# Patient Record
Sex: Male | Born: 1935
Health system: Southern US, Community
[De-identification: ages and names within clinical notes are randomized; demographics above are authoritative.]

## PROBLEM LIST (undated history)

## (undated) DIAGNOSIS — I10 Essential (primary) hypertension: Secondary | ICD-10-CM

## (undated) DIAGNOSIS — F4325 Adjustment disorder with mixed disturbance of emotions and conduct: Secondary | ICD-10-CM

## (undated) DIAGNOSIS — S065X9A Traumatic subdural hemorrhage with loss of consciousness of unspecified duration, initial encounter: Secondary | ICD-10-CM

## (undated) DIAGNOSIS — R29898 Other symptoms and signs involving the musculoskeletal system: Secondary | ICD-10-CM

## (undated) HISTORY — PX: HERNIA REPAIR: SHX51

---

## 1998-07-21 ENCOUNTER — Emergency Department (HOSPITAL_COMMUNITY): Admission: EM | Admit: 1998-07-21 | Discharge: 1998-07-21 | Payer: Self-pay | Admitting: Emergency Medicine

## 1998-07-21 ENCOUNTER — Encounter: Payer: Self-pay | Admitting: Emergency Medicine

## 2001-07-30 ENCOUNTER — Emergency Department (HOSPITAL_COMMUNITY): Admission: EM | Admit: 2001-07-30 | Discharge: 2001-07-30 | Payer: Self-pay | Admitting: Emergency Medicine

## 2001-07-30 ENCOUNTER — Encounter: Payer: Self-pay | Admitting: Emergency Medicine

## 2015-04-30 DIAGNOSIS — R1013 Epigastric pain: Secondary | ICD-10-CM | POA: Diagnosis not present

## 2015-06-18 DIAGNOSIS — R350 Frequency of micturition: Secondary | ICD-10-CM | POA: Diagnosis not present

## 2015-06-18 DIAGNOSIS — K4020 Bilateral inguinal hernia, without obstruction or gangrene, not specified as recurrent: Secondary | ICD-10-CM | POA: Diagnosis not present

## 2016-08-29 DIAGNOSIS — S161XXA Strain of muscle, fascia and tendon at neck level, initial encounter: Secondary | ICD-10-CM | POA: Diagnosis not present

## 2016-08-29 DIAGNOSIS — S46912A Strain of unspecified muscle, fascia and tendon at shoulder and upper arm level, left arm, initial encounter: Secondary | ICD-10-CM | POA: Diagnosis not present

## 2016-08-29 DIAGNOSIS — S46812A Strain of other muscles, fascia and tendons at shoulder and upper arm level, left arm, initial encounter: Secondary | ICD-10-CM | POA: Diagnosis not present

## 2016-09-02 DIAGNOSIS — S93401A Sprain of unspecified ligament of right ankle, initial encounter: Secondary | ICD-10-CM | POA: Diagnosis not present

## 2016-09-02 DIAGNOSIS — M25571 Pain in right ankle and joints of right foot: Secondary | ICD-10-CM | POA: Diagnosis not present

## 2016-09-02 DIAGNOSIS — M79671 Pain in right foot: Secondary | ICD-10-CM | POA: Diagnosis not present

## 2016-09-02 DIAGNOSIS — S9031XA Contusion of right foot, initial encounter: Secondary | ICD-10-CM | POA: Diagnosis not present

## 2016-09-02 DIAGNOSIS — M7989 Other specified soft tissue disorders: Secondary | ICD-10-CM | POA: Diagnosis not present

## 2016-09-14 DIAGNOSIS — F319 Bipolar disorder, unspecified: Secondary | ICD-10-CM | POA: Diagnosis not present

## 2016-09-14 DIAGNOSIS — Z6831 Body mass index (BMI) 31.0-31.9, adult: Secondary | ICD-10-CM | POA: Diagnosis not present

## 2016-09-14 DIAGNOSIS — K4020 Bilateral inguinal hernia, without obstruction or gangrene, not specified as recurrent: Secondary | ICD-10-CM | POA: Diagnosis not present

## 2016-09-15 DIAGNOSIS — K4020 Bilateral inguinal hernia, without obstruction or gangrene, not specified as recurrent: Secondary | ICD-10-CM | POA: Diagnosis not present

## 2016-09-26 DIAGNOSIS — Z5331 Laparoscopic surgical procedure converted to open procedure: Secondary | ICD-10-CM | POA: Diagnosis not present

## 2016-09-26 DIAGNOSIS — K4000 Bilateral inguinal hernia, with obstruction, without gangrene, not specified as recurrent: Secondary | ICD-10-CM | POA: Diagnosis not present

## 2016-10-01 DIAGNOSIS — K469 Unspecified abdominal hernia without obstruction or gangrene: Secondary | ICD-10-CM | POA: Diagnosis not present

## 2016-10-11 DIAGNOSIS — Z6830 Body mass index (BMI) 30.0-30.9, adult: Secondary | ICD-10-CM | POA: Diagnosis not present

## 2016-10-11 DIAGNOSIS — R002 Palpitations: Secondary | ICD-10-CM | POA: Diagnosis not present

## 2016-10-11 DIAGNOSIS — Z9889 Other specified postprocedural states: Secondary | ICD-10-CM | POA: Diagnosis not present

## 2016-10-11 DIAGNOSIS — Z8659 Personal history of other mental and behavioral disorders: Secondary | ICD-10-CM | POA: Diagnosis not present

## 2016-11-16 DIAGNOSIS — F4329 Adjustment disorder with other symptoms: Secondary | ICD-10-CM | POA: Diagnosis not present

## 2018-05-29 ENCOUNTER — Encounter (HOSPITAL_COMMUNITY): Payer: Self-pay | Admitting: Emergency Medicine

## 2018-05-29 ENCOUNTER — Other Ambulatory Visit: Payer: Self-pay

## 2018-05-29 ENCOUNTER — Emergency Department (HOSPITAL_COMMUNITY): Payer: Medicare HMO

## 2018-05-29 ENCOUNTER — Ambulatory Visit (INDEPENDENT_AMBULATORY_CARE_PROVIDER_SITE_OTHER)
Admission: EM | Admit: 2018-05-29 | Discharge: 2018-05-29 | Disposition: A | Discharge status: Home / Self Care | Payer: Medicare HMO | Source: Home / Self Care

## 2018-05-29 ENCOUNTER — Emergency Department (HOSPITAL_COMMUNITY)
Admission: EM | Admit: 2018-05-29 | Discharge: 2018-05-29 | Disposition: A | Discharge status: Home / Self Care | Payer: Medicare HMO | Attending: Emergency Medicine | Admitting: Emergency Medicine

## 2018-05-29 DIAGNOSIS — R079 Chest pain, unspecified: Secondary | ICD-10-CM | POA: Insufficient documentation

## 2018-05-29 DIAGNOSIS — Z5321 Procedure and treatment not carried out due to patient leaving prior to being seen by health care provider: Secondary | ICD-10-CM | POA: Insufficient documentation

## 2018-05-29 DIAGNOSIS — R0789 Other chest pain: Secondary | ICD-10-CM | POA: Diagnosis not present

## 2018-05-29 NOTE — ED Triage Notes (Signed)
Pt states for the last 5 days hes had some dull chest pain on the left side, pt states he burps and it still doesn't go away, thinks it might be gas. Pt states he has no medical problems and is not on any meds. Pt appears healthy. Pt states the pain comes and goes. EKG obtained, given to Dr. Tracie HarrierHagler, per MD pt needs eval in ER to rule out cardiac event. Pt agreeable to plan, will drive himself to ER.

## 2018-05-29 NOTE — ED Notes (Signed)
Pt approached this NT and stated that he had to leave. Informed pt that if he left and came back he would have to start the process all over again. Pt stated understanding and expressed that he wanted to leave.

## 2018-05-29 NOTE — ED Notes (Signed)
Pt refused lab work.

## 2018-05-29 NOTE — ED Triage Notes (Signed)
Pt presents with CP x 4 days to the L side that he reports pain as sharp and heavy; pt denies sob, n/v, diaphoresis, some lightheadedness that has resolved; pt reports taking some vitamins/minerals that may have contributed to the pain;

## 2019-10-07 ENCOUNTER — Other Ambulatory Visit: Payer: Self-pay

## 2019-10-07 ENCOUNTER — Encounter (HOSPITAL_COMMUNITY): Payer: Self-pay | Admitting: Emergency Medicine

## 2019-10-07 ENCOUNTER — Ambulatory Visit (HOSPITAL_COMMUNITY)
Admission: EM | Admit: 2019-10-07 | Discharge: 2019-10-07 | Disposition: A | Discharge status: Home / Self Care | Payer: Medicare HMO | Attending: Family Medicine | Admitting: Family Medicine

## 2019-10-07 ENCOUNTER — Ambulatory Visit (INDEPENDENT_AMBULATORY_CARE_PROVIDER_SITE_OTHER): Payer: Medicare HMO

## 2019-10-07 DIAGNOSIS — R0602 Shortness of breath: Secondary | ICD-10-CM

## 2019-10-07 DIAGNOSIS — Z20822 Contact with and (suspected) exposure to covid-19: Secondary | ICD-10-CM | POA: Diagnosis not present

## 2019-10-07 DIAGNOSIS — J069 Acute upper respiratory infection, unspecified: Secondary | ICD-10-CM | POA: Insufficient documentation

## 2019-10-07 DIAGNOSIS — U071 COVID-19: Secondary | ICD-10-CM | POA: Insufficient documentation

## 2019-10-07 NOTE — ED Provider Notes (Signed)
[place]    CSN: [id number] Arrival date & time: 10/07/19  1744      History   Chief Complaint Chief Complaint  Patient presents with  . Cough    HPI Jerry Flores is a 84 y.o. male.   HPI  The patient states that he has been sick since 09/30/2019.  He has had lethargy, cough, hot flashes with sweats, slight dizziness, weakness, and runny nose.  He does feel short of breath.  He lacks stamina for walking or any exercise.  His appetite is diminished. He states he had a close friend that was positive for Covid.  He helped to take care of him.  He did try to wear his mask and use good handwashing. He denies any underlying medical illness.  He states he is on no prescription medication.  No heart disease.  No lung disease.  No diabetes. He is 84 years of age.  He lives independently.  History reviewed. No pertinent past medical history.  There are no problems to display for this patient.   Past Surgical History:  Procedure Laterality Date  . HERNIA REPAIR         Home Medications    Prior to Admission medications   Not on File    Family History History reviewed. No pertinent family history.  Social History Social History   Tobacco Use  . Smoking status: Never Smoker  Substance Use Topics  . Alcohol use: Not Currently  . Drug use: Not Currently     Allergies   Patient has no known allergies.   Review of Systems Review of Systems  Constitutional: Positive for activity change, appetite change, diaphoresis and fatigue.  HENT: Positive for congestion and rhinorrhea.   Respiratory: Positive for shortness of breath.   Musculoskeletal: Positive for myalgias.  Neurological: Positive for dizziness. Negative for headaches.     Physical Exam Triage Vital Signs ED Triage Vitals  Enc Vitals Group     BP 10/07/19 1811 114/68     Pulse Rate 10/07/19 1811 87     Resp 10/07/19 1811 19     Temp 10/07/19 1811 98.9 F (37.2 C)     Temp Source  10/07/19 1811 Oral     SpO2 10/07/19 1811 97 %     Weight --      Height --      Head Circumference --      Peak Flow --      Pain Score 10/07/19 1806 5     Pain Loc --      Pain Edu? --      Excl. in [place]? --    No data found.  Updated Vital Signs BP 114/68 (BP Location: Right Arm)   Pulse 87   Temp 98.9 F (37.2 C) (Oral)   Resp 19   SpO2 97%      Physical Exam Constitutional:      General: He is not in acute distress.    Appearance: He is well-developed and normal weight.     Comments: Pleasant.  Articulate.  Appears mildly tired  HENT:     Head: Normocephalic and atraumatic.     Right Ear: Tympanic membrane and ear canal normal.     Left Ear: Tympanic membrane and ear canal normal.     Nose: Nose normal.     Mouth/Throat:     Pharynx: No posterior oropharyngeal erythema.  Eyes:     Conjunctiva/sclera: Conjunctivae normal.     Pupils:  Pupils are equal, round, and reactive to light.  Cardiovascular:     Rate and Rhythm: Normal rate and regular rhythm.     Comments: Frequent ectopy.  Soft systolic murmur Pulmonary:     Effort: Pulmonary effort is normal. No respiratory distress.     Breath sounds: Rales present.     Comments: Faint crackles at bases Abdominal:     General: There is no distension.     Palpations: Abdomen is soft.  Musculoskeletal:        General: Normal range of motion.     Cervical back: Normal range of motion.  Lymphadenopathy:     Cervical: No cervical adenopathy.  Skin:    General: Skin is warm and dry.  Neurological:     Mental Status: He is alert. Mental status is at baseline.  Psychiatric:        Mood and Affect: Mood normal.        Behavior: Behavior normal.      UC Treatments / Results  Labs (all labs ordered are listed, but only abnormal results are displayed) Labs Reviewed  NOVEL CORONAVIRUS, NAA (HOSP ORDER, SEND-OUT TO REF LAB; TAT 18-24 HRS)    EKG patient has a Mobitz type II block with some sinus tachycardia.  No ST  or T wave changes indicating acute ischemia.  Discussed with patient.  He refuses to go to the ER.  He does state that he will go to the ER if he starts feeling worse instead of better.  He will call his PCP tomorrow   Radiology DG Chest 2 View  Result Date: 10/07/2019 CLINICAL DATA:  Shortness of breath. EXAM: CHEST - 2 VIEW COMPARISON:  May 29, 2018. FINDINGS: The heart size and mediastinal contours are within normal limits. Both lungs are clear. No pneumothorax or pleural effusion is noted. The visualized skeletal structures are unremarkable. IMPRESSION: No active cardiopulmonary disease. Electronically Signed   By: Lupita Raider M.D.   On: 10/07/2019 18:55    Procedures Procedures (including critical care time)  Medications Ordered in UC Medications - No data to display  Initial Impression / Assessment and Plan / UC Course  I have reviewed the triage vital signs and the nursing notes.  Pertinent labs & imaging results that were available during my care of the patient were reviewed by me and considered in my medical decision making (see chart for details).      Final Clinical Impressions(s) / UC Diagnoses   Final diagnoses:  Viral URI with cough  Close exposure to COVID-19 virus     Discharge Instructions     Home to rest You may take tylenol for pain or fever Your chest x ray shows no pneumonia Your blood pressure and oxygen are excellent Your COVID test will be available If it is positive you will be called about an infusion The EKG should be followed up by your PCP, but it is not dangerous     ED Prescriptions    None     PDMP not reviewed this encounter.   Eustace Moore, MD 10/07/19 Jerene Bears

## 2019-10-07 NOTE — ED Triage Notes (Signed)
09/30/2019 was the start of feeling bad.  Patient has a cough, hot flash, slight dizziness, slight weakness, lethargic, and runny nose.

## 2019-10-07 NOTE — Discharge Instructions (Addendum)
Home to rest You may take tylenol for pain or fever Your chest x ray shows no pneumonia Your blood pressure and oxygen are excellent Your COVID test will be available If it is positive you will be called about an infusion The EKG should be followed up by your PCP,You must call him tomorrow.

## 2019-10-09 ENCOUNTER — Telehealth (HOSPITAL_COMMUNITY): Payer: Self-pay | Admitting: Emergency Medicine

## 2019-10-09 LAB — NOVEL CORONAVIRUS, NAA (HOSP ORDER, SEND-OUT TO REF LAB; TAT 18-24 HRS): SARS-CoV-2, NAA: DETECTED — AB

## 2019-10-09 NOTE — Telephone Encounter (Signed)
Your test for COVID-19 was positive, meaning that you were infected with the novel coronavirus and could give the germ to others.  Please continue isolation at home for at least 10 days since the start of your symptoms. If you do not have symptoms, please isolate at home for 10 days from the day you were tested. Once you complete your 10 day quarantine, you may return to normal activities as long as you've not had a fever for over 24 hours(without taking fever reducing medicine) and your symptoms are improving. Please continue good preventive care measures, including:  frequent hand-washing, avoid touching your face, cover coughs/sneezes, stay out of crowds and keep a 6 foot distance from others.  Go to the nearest hospital emergency room if fever/cough/breathlessness are severe or illness seems like a threat to life.  Patient contacted by phone and made aware of    results. Pt verbalized understanding and had all questions answered.  Pt states he is feeling somewhat better, still no appetite, but drinking fluids fine. Pt will call his PCP about his dx and follow up.

## 2019-10-10 ENCOUNTER — Telehealth: Payer: Self-pay | Admitting: Physician Assistant

## 2019-10-10 ENCOUNTER — Telehealth: Payer: Self-pay | Admitting: Nurse Practitioner

## 2019-10-10 NOTE — Telephone Encounter (Signed)
Called to discuss with Jerry Flores about Covid symptoms and the use of bamlanivimab, a monoclonal antibody infusion for those with mild to moderate Covid symptoms and at a high risk of hospitalization.     Pt is qualified for this infusion at the [facility] infusion center due to co-morbid conditions and/or a member of an at-risk group. Last day patient would be eligible for infusion is 10/15/19.   Also received message via hotline from Dr. Delton See requesting patient receive infusion.   Unable to reach patient. Voicemail left with contact information.   Jerry Flores, AGPCNP-BC Pager: [phone] Amion: Jerry Flores

## 2019-10-10 NOTE — Telephone Encounter (Signed)
Jerry Flores    My name is [name] and I am a Advice worker with [facility]. I am reaching out to see how you are feeling since coming to [facility] to be tested for COVID-19 infection. I am hopeful this message finds you feeling better.    I would also like to inform you that we are attempting to contact some of our patients who have recently been diagnosed with COVID-19 with active mild to moderate symptoms regarding a potential treatment available. This treatment has been shown to reduce the risk of hospitalization and decrease the risk for severe symptoms related to COVID-19 in a select group of patients with risk factors / medical conditions we believe put people at a higher risk for this infection.    The Food and Drug Administration (FDA) has given emergency use of a new medication to treat those with mild to moderate symptoms who have risk factors that have been identified for severe symptoms related to COVID-19. This new treatment is a monoclonal antibody - the way it works its that it attaches like a magnet to the SARS-CoV2 virus (the virus that causes COVID-19) and stops it from infecting more cells in your body. It does not kill the virus but it prevents it from spreading throughout your body with the hope that it will decrease your symptoms after it is given.    This new medication is an intravenous (IV) infusion that is given over one hour in our outpatient infusion clinic here in [place]. We will require you to stay roughly 60 minutes after the infusion to ensure you are tolerating it and watch for any allergic reaction to the medication. More information will also be given to you at the time of your appointment.    The side effects that have been seen with this treatment:  2-4% of recipients experience nausea, vomiting, diarrhea, dizziness, headaches, itching  There have been no serious infusion related reactions  Of the 850 patients who received the infusion one did  have an allergic response that resolved once the infusion was stopped - this is why we monitor all of our patients closely for an hour after the infusion.    If you are interested in our assistance to determine if you are a good candidate please call [phone] with questions.   Alternatively, one of our team members will attempt to reach back out to you (I did attempt to call your mobile and home phone numbers but did not get an answer on either line).   For your reference:  [email or web address]  Regards,  [name], PA - C

## 2020-01-26 ENCOUNTER — Encounter (HOSPITAL_COMMUNITY): Payer: Self-pay

## 2020-01-26 ENCOUNTER — Ambulatory Visit (HOSPITAL_COMMUNITY)
Admission: EM | Admit: 2020-01-26 | Discharge: 2020-01-26 | Disposition: A | Discharge status: Home / Self Care | Payer: Medicare HMO | Attending: Family Medicine | Admitting: Family Medicine

## 2020-01-26 ENCOUNTER — Other Ambulatory Visit: Payer: Self-pay

## 2020-01-26 DIAGNOSIS — R21 Rash and other nonspecific skin eruption: Secondary | ICD-10-CM | POA: Diagnosis not present

## 2020-01-26 MED ORDER — TRIAMCINOLONE ACETONIDE 0.1 % EX CREA: 1.0000 "application " | TOPICAL_CREAM | Freq: Two times a day (BID) | CUTANEOUS | 0 refills | Status: DC

## 2020-01-26 NOTE — ED Provider Notes (Signed)
[place]    CSN: [id number] Arrival date & time: 01/26/20  1929      History   Chief Complaint Chief Complaint  Patient presents with  . Rash    HPI Devontre Siedschlag is a 84 y.o. male.   He is presenting with rash and pruritus on the left posterior shoulder that radiates down the flank.  He has been sleeping on a new friend's sheets.  She does get some of them at [facility].  Denies seeing any bedbugs.  He has put [place] on it with little improvement.  Denies any contacts.  HPI  History reviewed. No pertinent past medical history.  There are no problems to display for this patient.   Past Surgical History:  Procedure Laterality Date  . HERNIA REPAIR         Home Medications    Prior to Admission medications   Medication Sig Start Date End Date Taking? Authorizing Provider  triamcinolone cream (KENALOG) 0.1 % Apply 1 application topically 2 (two) times daily. 01/26/20   Rosemarie Ax, MD    Family History History reviewed. No pertinent family history.  Social History Social History   Tobacco Use  . Smoking status: Never Smoker  Substance Use Topics  . Alcohol use: Not Currently  . Drug use: Not Currently     Allergies   Patient has no known allergies.   Review of Systems Review of Systems  See HPI  Physical Exam Triage Vital Signs ED Triage Vitals [01/26/20 2017]  Enc Vitals Group     BP      Pulse      Resp      Temp      Temp src      SpO2      Weight      Height      Head Circumference      Peak Flow      Pain Score 0     Pain Loc      Pain Edu?      Excl. in [place]?    No data found.  Updated Vital Signs BP (!) 161/90 (BP Location: Right Arm)   Pulse 72   Temp 98 F (36.7 C) (Oral)   Resp 17   SpO2 98%   Visual Acuity Right Eye Distance:   Left Eye Distance:   Bilateral Distance:    Right Eye Near:   Left Eye Near:    Bilateral Near:     Physical Exam Gen: NAD, alert, cooperative with exam,  well-appearing ENT: normal lips, normal nasal mucosa,  Eye: normal EOM, normal conjunctiva and lids Skin: No vesicles or lesions, excoriated areas on the posterior glenohumeral joint as well as down the mid flank region Neuro: normal tone, normal sensation to touch Psych:  normal insight, alert and oriented    UC Treatments / Results  Labs (all labs ordered are listed, but only abnormal results are displayed) Labs Reviewed - No data to display  EKG   Radiology No results found.  Procedures Procedures (including critical care time)  Medications Ordered in UC Medications - No data to display  Initial Impression / Assessment and Plan / UC Course  I have reviewed the triage vital signs and the nursing notes.  Pertinent labs & imaging results that were available during my care of the patient were reviewed by me and considered in my medical decision making (see chart for details).     Mr. Nyoka Cowden is an 84 year old  male is presenting with rash.  Possibly related to dry skin.  Seems less likely for bedbugs.  Does not appear to be contact dermatitis.  Will provide triamcinolone.  Counseled on lotion and supportive care.  Given indications to follow-up return.  Final Clinical Impressions(s) / UC Diagnoses   Final diagnoses:  Rash     Discharge Instructions     Please try to avoid scratching the area  Please practice good hygiene.  Please check for any bugs.  Please try a lotion  Please try the medicine  Please follow up if your symptoms fail to improve.     ED Prescriptions    Medication Sig Dispense Auth. Provider   triamcinolone cream (KENALOG) 0.1 % Apply 1 application topically 2 (two) times daily. 30 g Myra Rude, MD     PDMP not reviewed this encounter.   Myra Rude, MD 01/26/20 2059

## 2020-01-26 NOTE — Discharge Instructions (Addendum)
Please try to avoid scratching the area  Please practice good hygiene.  Please check for any bugs.  Please try a lotion  Please try the medicine  Please follow up if your symptoms fail to improve.

## 2020-01-26 NOTE — ED Triage Notes (Signed)
Pt presents with rash on left side of back for the past few days.

## 2020-04-08 ENCOUNTER — Other Ambulatory Visit: Payer: Self-pay

## 2020-04-08 ENCOUNTER — Ambulatory Visit (HOSPITAL_COMMUNITY)
Admission: EM | Admit: 2020-04-08 | Discharge: 2020-04-08 | Disposition: A | Discharge status: Home / Self Care | Payer: Medicare Other | Attending: Urgent Care | Admitting: Urgent Care

## 2020-04-08 ENCOUNTER — Encounter (HOSPITAL_COMMUNITY): Payer: Self-pay | Admitting: Emergency Medicine

## 2020-04-08 DIAGNOSIS — M545 Low back pain, unspecified: Secondary | ICD-10-CM

## 2020-04-08 DIAGNOSIS — J029 Acute pharyngitis, unspecified: Secondary | ICD-10-CM | POA: Diagnosis not present

## 2020-04-08 DIAGNOSIS — S39012A Strain of muscle, fascia and tendon of lower back, initial encounter: Secondary | ICD-10-CM | POA: Diagnosis present

## 2020-04-08 DIAGNOSIS — M7989 Other specified soft tissue disorders: Secondary | ICD-10-CM | POA: Diagnosis present

## 2020-04-08 LAB — POCT RAPID STREP A, ED / UC: Streptococcus, Group A Screen (Direct): NEGATIVE

## 2020-04-08 MED ORDER — TRAMADOL HCL 50 MG PO TABS: 50.0000 mg | ORAL_TABLET | Freq: Four times a day (QID) | ORAL | 0 refills | Status: DC | PRN

## 2020-04-08 MED ORDER — METHOCARBAMOL 500 MG PO TABS: 500.0000 mg | ORAL_TABLET | Freq: Three times a day (TID) | ORAL | 0 refills | Status: DC | PRN

## 2020-04-08 NOTE — ED Notes (Addendum)
Pt has Vascular Ultrasound on [facility] @ 9 am - Tomorrow  Friday 04/09/2020

## 2020-04-08 NOTE — Discharge Instructions (Addendum)
Jerry Flores, you have an appointment for for vascular ultrasound to rule out a blood clot in your left lower leg at 9 AM tomorrow through the [facility]. Please try to arrive 15 minutes early. Please schedule Tylenol at 500 mg - 650 mg once every 6 hours as needed for aches and pains.  If you still have pain despite taking Tylenol regularly, this is breakthrough pain.  You can use tramadol once every 6 hours for this.  Once your pain is better controlled, switch back to just Tylenol. You can use the same medication regimen for your throat pain. I will let you know if your throat culture comes back and is positive for an infection.

## 2020-04-08 NOTE — ED Provider Notes (Signed)
[facility]   MRN: [id number] DOB: August 07, 1936  Subjective:   Jerry Flores is a 84 y.o. male presenting for 4-day history of mild intermittent throat pain, hoarseness.  Patient's primary concern is back pain however, states that he had to change a tire recently and was very challenging.  States that he had to jump up and down on the tire iron to loosen it.  He also works in [employer].  Has had persistent intermittent left lower leg swelling for weeks now.  Denies history of DVT, falls, trauma, weakness, numbness or tingling, dysuria, hematuria, history of renal stones.  Denies chest pain or shortness of breath.  Has not tried many medications for relief.  No current facility-administered medications for this encounter.  Current Outpatient Medications:  .  triamcinolone cream (KENALOG) 0.1 %, Apply 1 application topically 2 (two) times daily., Disp: 30 g, Rfl: 0   No Known Allergies  History reviewed. No pertinent past medical history.   Past Surgical History:  Procedure Laterality Date  . HERNIA REPAIR      No family history on file.  Social History   Tobacco Use  . Smoking status: Never Smoker  Substance Use Topics  . Alcohol use: Not Currently  . Drug use: Not Currently    ROS   Objective:   Vitals: BP (!) 147/85 (BP Location: Left Arm)   Pulse 90   Temp 98.4 F (36.9 C) (Oral)   Resp 15   SpO2 100%   Physical Exam Constitutional:      General: He is not in acute distress.    Appearance: Normal appearance. He is well-developed. He is not ill-appearing, toxic-appearing or diaphoretic.  HENT:     Head: Normocephalic and atraumatic.     Right Ear: External ear normal.     Left Ear: External ear normal.     Nose: Nose normal.     Mouth/Throat:     Mouth: Mucous membranes are moist.     Pharynx: Oropharynx is clear. No pharyngeal swelling, oropharyngeal exudate, posterior oropharyngeal erythema or uvula swelling.  Eyes:     General: No  scleral icterus.       Right eye: No discharge.        Left eye: No discharge.     Extraocular Movements: Extraocular movements intact.     Conjunctiva/sclera: Conjunctivae normal.     Pupils: Pupils are equal, round, and reactive to light.  Cardiovascular:     Rate and Rhythm: Normal rate and regular rhythm.     Heart sounds: Normal heart sounds. No murmur heard.  No friction rub. No gallop.   Pulmonary:     Effort: Pulmonary effort is normal. No respiratory distress.     Breath sounds: Normal breath sounds. No stridor. No wheezing, rhonchi or rales.  Musculoskeletal:     Cervical back: Normal range of motion and neck supple. No rigidity or tenderness.     Lumbar back: Tenderness (along area outlined) present. No swelling, edema, deformity, signs of trauma, lacerations, spasms or bony tenderness. Normal range of motion. Negative right straight leg raise test and negative left straight leg raise test. No scoliosis.       Back:     Left lower leg: Swelling present. No deformity, lacerations, tenderness or bony tenderness. 2+ Edema present.     Comments: Strength 5/5 for upper and lower extremities.  Negative Homan sign.  Lymphadenopathy:     Cervical: No cervical adenopathy.  Skin:    General:  Skin is warm and dry.  Neurological:     Mental Status: He is alert and oriented to person, place, and time.     Cranial Nerves: No cranial nerve deficit.     Motor: No weakness.     Coordination: Coordination normal.     Gait: Gait normal.     Deep Tendon Reflexes: Reflexes normal.  Psychiatric:        Mood and Affect: Mood normal.        Behavior: Behavior normal.        Thought Content: Thought content normal.        Judgment: Judgment normal.     Results for orders placed or performed during the hospital encounter of 04/08/20 (from the past 24 hour(s))  POCT Rapid Strep A     Status: None   Collection Time: 04/08/20  3:21 PM  Result Value Ref Range   Streptococcus, Group A Screen  (Direct) NEGATIVE NEGATIVE   Assessment and Plan :   I have reviewed the PDMP during this encounter.  1. Sore throat   2. Acute bilateral low back pain without sciatica   3. Strain of lumbar region, initial encounter   4. Left leg swelling     Ultrasound pending for rule out of DVT.  Strep culture pending.  Recommended patient rest, hold off on working for now.  Schedule Tylenol for lumbar strain/back pain.  Use tramadol for breakthrough pain.  Patient requested a muscle relaxant as well, Robaxin can be used for this. Counseled patient on potential for adverse effects with medications prescribed/recommended today, ER and return-to-clinic precautions discussed, patient verbalized understanding.    Wallis Bamberg, [place] 04/08/20 [phone]

## 2020-04-08 NOTE — ED Triage Notes (Signed)
Pt c/o throat has been hoarse for about 4 days. Pt also c/o back pain he states he was helping her neighbor with her car and did not have a jack and tried to lift the car.

## 2020-04-09 ENCOUNTER — Ambulatory Visit (HOSPITAL_COMMUNITY): Payer: Medicare Other | Attending: Urgent Care

## 2020-04-09 ENCOUNTER — Ambulatory Visit (HOSPITAL_COMMUNITY): Admit: 2020-04-09 | Payer: Medicare Other

## 2020-04-09 ENCOUNTER — Other Ambulatory Visit (HOSPITAL_COMMUNITY): Payer: Self-pay | Admitting: Urgent Care

## 2020-04-09 DIAGNOSIS — M79605 Pain in left leg: Secondary | ICD-10-CM

## 2020-04-11 ENCOUNTER — Encounter (HOSPITAL_COMMUNITY): Payer: Self-pay | Admitting: Emergency Medicine

## 2020-04-11 ENCOUNTER — Emergency Department (HOSPITAL_COMMUNITY)
Admission: EM | Admit: 2020-04-11 | Discharge: 2020-04-11 | Disposition: A | Discharge status: Home / Self Care | Payer: Medicare Other | Attending: Emergency Medicine | Admitting: Emergency Medicine

## 2020-04-11 ENCOUNTER — Other Ambulatory Visit: Payer: Self-pay

## 2020-04-11 DIAGNOSIS — M545 Low back pain: Secondary | ICD-10-CM | POA: Insufficient documentation

## 2020-04-11 DIAGNOSIS — M79604 Pain in right leg: Secondary | ICD-10-CM | POA: Diagnosis not present

## 2020-04-11 DIAGNOSIS — M79605 Pain in left leg: Secondary | ICD-10-CM | POA: Diagnosis not present

## 2020-04-11 DIAGNOSIS — Z5321 Procedure and treatment not carried out due to patient leaving prior to being seen by health care provider: Secondary | ICD-10-CM | POA: Diagnosis not present

## 2020-04-11 LAB — CULTURE, GROUP A STREP (THRC)

## 2020-04-11 NOTE — ED Triage Notes (Signed)
C/o lower back pain that radiates down bilateral legs x 2 days.  Pain started after changing a tire.  Ambulatory to triage.

## 2020-04-11 NOTE — ED Notes (Signed)
Called x 3 no answer 

## 2020-04-19 ENCOUNTER — Emergency Department (HOSPITAL_COMMUNITY): Payer: Medicare Other

## 2020-04-19 ENCOUNTER — Emergency Department (HOSPITAL_COMMUNITY)
Admission: EM | Admit: 2020-04-19 | Discharge: 2020-04-20 | Disposition: A | Discharge status: Home / Self Care | Payer: Medicare Other | Attending: Emergency Medicine | Admitting: Emergency Medicine

## 2020-04-19 ENCOUNTER — Other Ambulatory Visit: Payer: Self-pay

## 2020-04-19 DIAGNOSIS — S0990XA Unspecified injury of head, initial encounter: Secondary | ICD-10-CM | POA: Diagnosis present

## 2020-04-19 DIAGNOSIS — R609 Edema, unspecified: Secondary | ICD-10-CM

## 2020-04-19 DIAGNOSIS — Y9289 Other specified places as the place of occurrence of the external cause: Secondary | ICD-10-CM | POA: Diagnosis not present

## 2020-04-19 DIAGNOSIS — S0083XA Contusion of other part of head, initial encounter: Secondary | ICD-10-CM | POA: Diagnosis not present

## 2020-04-19 DIAGNOSIS — Y9389 Activity, other specified: Secondary | ICD-10-CM | POA: Insufficient documentation

## 2020-04-19 DIAGNOSIS — Y999 Unspecified external cause status: Secondary | ICD-10-CM | POA: Diagnosis not present

## 2020-04-19 LAB — CBC WITH DIFFERENTIAL/PLATELET
Abs Immature Granulocytes: 0.01 10*3/uL (ref 0.00–0.07)
Basophils Absolute: 0 10*3/uL (ref 0.0–0.1)
Basophils Relative: 0 %
Eosinophils Absolute: 0.1 10*3/uL (ref 0.0–0.5)
Eosinophils Relative: 1 %
HCT: 38.2 % — ABNORMAL LOW (ref 39.0–52.0)
Hemoglobin: 12.4 g/dL — ABNORMAL LOW (ref 13.0–17.0)
Immature Granulocytes: 0 %
Lymphocytes Relative: 27 %
Lymphs Abs: 1.2 10*3/uL (ref 0.7–4.0)
MCH: 31.2 pg (ref 26.0–34.0)
MCHC: 32.5 g/dL (ref 30.0–36.0)
MCV: 96.2 fL (ref 80.0–100.0)
Monocytes Absolute: 0.4 10*3/uL (ref 0.1–1.0)
Monocytes Relative: 9 %
Neutro Abs: 2.9 10*3/uL (ref 1.7–7.7)
Neutrophils Relative %: 63 %
Platelets: 246 10*3/uL (ref 150–400)
RBC: 3.97 MIL/uL — ABNORMAL LOW (ref 4.22–5.81)
RDW: 13.6 % (ref 11.5–15.5)
WBC: 4.7 10*3/uL (ref 4.0–10.5)
nRBC: 0 % (ref 0.0–0.2)

## 2020-04-19 LAB — COMPREHENSIVE METABOLIC PANEL
ALT: 41 U/L (ref 0–44)
AST: 50 U/L — ABNORMAL HIGH (ref 15–41)
Albumin: 3.5 g/dL (ref 3.5–5.0)
Alkaline Phosphatase: 55 U/L (ref 38–126)
Anion gap: 10 (ref 5–15)
BUN: 11 mg/dL (ref 8–23)
CO2: 24 mmol/L (ref 22–32)
Calcium: 9.1 mg/dL (ref 8.9–10.3)
Chloride: 104 mmol/L (ref 98–111)
Creatinine, Ser: 1.28 mg/dL — ABNORMAL HIGH (ref 0.61–1.24)
GFR calc Af Amer: >60 mL/min (ref >60)
GFR calc non Af Amer: 52 mL/min — ABNORMAL LOW (ref >60)
Glucose, Bld: 126 mg/dL — ABNORMAL HIGH (ref 70–99)
Potassium: 3.6 mmol/L (ref 3.5–5.1)
Sodium: 138 mmol/L (ref 135–145)
Total Bilirubin: 0.9 mg/dL (ref 0.3–1.2)
Total Protein: 6.8 g/dL (ref 6.5–8.1)

## 2020-04-19 LAB — BRAIN NATRIURETIC PEPTIDE: B Natriuretic Peptide: 61.1 pg/mL (ref 0.0–100.0)

## 2020-04-19 MED ORDER — ACETAMINOPHEN 500 MG PO TABS: 1000.0000 mg | ORAL_TABLET | Freq: Once | ORAL | Status: DC

## 2020-04-19 MED ORDER — OXYCODONE HCL 5 MG PO TABS: 5.0000 mg | ORAL_TABLET | Freq: Once | ORAL | Status: DC

## 2020-04-19 NOTE — Discharge Instructions (Signed)
Follow-up with your family doctor in the office.  The CT scan did not show any broken bones did show some increased lymphoid tissue, your family doctor may choose to reimage this with a different imaging study.

## 2020-04-19 NOTE — Discharge Planning (Signed)
RNCM provided [employer] as pt has no transportation to [place], [place], [place] from hospital.
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
## 2020-04-20 NOTE — ED Notes (Signed)
Pt was discharged in NAD on 04/19/2020 at approximately 1100.

## 2020-05-30 ENCOUNTER — Emergency Department (HOSPITAL_COMMUNITY)
Admission: EM | Admit: 2020-05-30 | Discharge: 2020-05-30 | Discharge status: Against Medical Advice | Payer: Medicare Other

## 2020-05-30 ENCOUNTER — Other Ambulatory Visit: Payer: Self-pay

## 2020-05-30 ENCOUNTER — Encounter (HOSPITAL_COMMUNITY): Payer: Self-pay | Admitting: Emergency Medicine

## 2020-05-30 NOTE — ED Notes (Signed)
Called pt name x3. No response from pt.

## 2020-05-30 NOTE — ED Triage Notes (Signed)
Patient arrives to ED via EMS with complaints of lower extremity swelling x1 month. States that he is now having trouble walking.

## 2020-05-31 ENCOUNTER — Encounter (HOSPITAL_COMMUNITY): Payer: Self-pay | Admitting: Emergency Medicine

## 2020-05-31 ENCOUNTER — Ambulatory Visit (HOSPITAL_COMMUNITY)
Admission: EM | Admit: 2020-05-31 | Discharge: 2020-05-31 | Disposition: A | Discharge status: Home / Self Care | Payer: Medicare Other | Attending: Family Medicine | Admitting: Family Medicine

## 2020-05-31 DIAGNOSIS — R609 Edema, unspecified: Secondary | ICD-10-CM
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
## 2020-05-31 NOTE — Discharge Instructions (Signed)
Please use compression stockings to help with your swelling.  Please follow up with your primary care provider this week for a recheck.  Please do not take more than 1 pill twice a day, as it can be very dangerous to take too much of the lasix you have.  I do recommend that your blood levels are checked before you continue taking this.

## 2020-05-31 NOTE — ED Notes (Signed)
Pt refused blood work  

## 2020-05-31 NOTE — ED Notes (Signed)
Pt 's ankles wrapped with ace wraps

## 2020-06-10 ENCOUNTER — Other Ambulatory Visit: Payer: Self-pay

## 2020-06-10 ENCOUNTER — Encounter (HOSPITAL_BASED_OUTPATIENT_CLINIC_OR_DEPARTMENT_OTHER): Payer: Self-pay | Admitting: *Deleted

## 2020-06-10 ENCOUNTER — Emergency Department (HOSPITAL_BASED_OUTPATIENT_CLINIC_OR_DEPARTMENT_OTHER)
Admission: EM | Admit: 2020-06-10 | Discharge: 2020-06-10 | Disposition: A | Discharge status: Home / Self Care | Payer: Medicare Other | Source: Home / Self Care | Attending: Emergency Medicine | Admitting: Emergency Medicine

## 2020-06-10 ENCOUNTER — Emergency Department (HOSPITAL_BASED_OUTPATIENT_CLINIC_OR_DEPARTMENT_OTHER): Payer: Medicare Other

## 2020-06-10 DIAGNOSIS — R600 Localized edema: Secondary | ICD-10-CM | POA: Insufficient documentation

## 2020-06-10 DIAGNOSIS — R29898 Other symptoms and signs involving the musculoskeletal system: Secondary | ICD-10-CM | POA: Diagnosis not present

## 2020-06-10 DIAGNOSIS — F319 Bipolar disorder, unspecified: Secondary | ICD-10-CM | POA: Diagnosis not present

## 2020-06-10 LAB — URINALYSIS, ROUTINE W REFLEX MICROSCOPIC
Bilirubin Urine: NEGATIVE
Glucose, UA: NEGATIVE mg/dL
Hgb urine dipstick: NEGATIVE
Ketones, ur: NEGATIVE mg/dL
Leukocytes,Ua: NEGATIVE
Nitrite: NEGATIVE
Protein, ur: NEGATIVE mg/dL
Specific Gravity, Urine: 1.02 (ref 1.005–1.030)
pH: 7 (ref 5.0–8.0)

## 2020-06-10 LAB — COMPREHENSIVE METABOLIC PANEL
ALT: 26 U/L (ref 0–44)
AST: 42 U/L — ABNORMAL HIGH (ref 15–41)
Albumin: 3.9 g/dL (ref 3.5–5.0)
Alkaline Phosphatase: 68 U/L (ref 38–126)
Anion gap: 9 (ref 5–15)
BUN: 13 mg/dL (ref 8–23)
CO2: 24 mmol/L (ref 22–32)
Calcium: 8.8 mg/dL — ABNORMAL LOW (ref 8.9–10.3)
Chloride: 102 mmol/L (ref 98–111)
Creatinine, Ser: 0.96 mg/dL (ref 0.61–1.24)
GFR calc non Af Amer: >60 mL/min (ref >60)
Glucose, Bld: 124 mg/dL — ABNORMAL HIGH (ref 70–99)
Potassium: 3.4 mmol/L — ABNORMAL LOW (ref 3.5–5.1)
Sodium: 135 mmol/L (ref 135–145)
Total Bilirubin: 0.5 mg/dL (ref 0.3–1.2)
Total Protein: 7.5 g/dL (ref 6.5–8.1)

## 2020-06-10 LAB — CBC WITH DIFFERENTIAL/PLATELET
Abs Immature Granulocytes: 0.01 10*3/uL (ref 0.00–0.07)
Basophils Absolute: 0 10*3/uL (ref 0.0–0.1)
Basophils Relative: 0 %
Eosinophils Absolute: 0.1 10*3/uL (ref 0.0–0.5)
Eosinophils Relative: 3 %
HCT: 38.8 % — ABNORMAL LOW (ref 39.0–52.0)
Hemoglobin: 12.7 g/dL — ABNORMAL LOW (ref 13.0–17.0)
Immature Granulocytes: 0 %
Lymphocytes Relative: 32 %
Lymphs Abs: 1.7 10*3/uL (ref 0.7–4.0)
MCH: 30.7 pg (ref 26.0–34.0)
MCHC: 32.7 g/dL (ref 30.0–36.0)
MCV: 93.7 fL (ref 80.0–100.0)
Monocytes Absolute: 0.4 10*3/uL (ref 0.1–1.0)
Monocytes Relative: 7 %
Neutro Abs: 2.9 10*3/uL (ref 1.7–7.7)
Neutrophils Relative %: 58 %
Platelets: 193 10*3/uL (ref 150–400)
RBC: 4.14 MIL/uL — ABNORMAL LOW (ref 4.22–5.81)
RDW: 13.5 % (ref 11.5–15.5)
WBC: 5.2 10*3/uL (ref 4.0–10.5)
nRBC: 0 % (ref 0.0–0.2)

## 2020-06-10 LAB — BRAIN NATRIURETIC PEPTIDE: B Natriuretic Peptide: 55.4 pg/mL (ref 0.0–100.0)

## 2020-06-10 MED ORDER — FUROSEMIDE 20 MG PO TABS
20.0000 mg | ORAL_TABLET | Freq: Every day | ORAL | 1 refills | Status: DC
Start: 2020-06-10 — End: 2020-06-17

## 2020-06-10 NOTE — ED Provider Notes (Signed)
  [facility] EMERGENCY DEPARTMENT Provider Note   CSN: [id number] Arrival date & time: 06/10/20  1538     History Chief Complaint  Patient presents with  . Leg Swelling    Jerry Flores is a 84 y.o. male.  Duplicate note  Other note in the system from that day that is complete.  But not able to cancel.        History reviewed. No pertinent past medical history.  There are no problems to display for this patient.   Past Surgical History:  Procedure Laterality Date  . HERNIA REPAIR         Family History  Family history unknown: Yes    Social History   Tobacco Use  . Smoking status: Never Smoker  . Smokeless tobacco: Never Used  Vaping Use  . Vaping Use: Never used  Substance Use Topics  . Alcohol use: Not Currently  . Drug use: Not Currently    Home Medications Prior to Admission medications   Medication Sig Start Date End Date Taking? Authorizing Provider  methocarbamol (ROBAXIN) 500 MG tablet Take 1 tablet (500 mg total) by mouth every 8 (eight) hours as needed for muscle spasms. 04/08/20   Wallis Bamberg, PA-C  traMADol (ULTRAM) 50 MG tablet Take 1 tablet (50 mg total) by mouth every 6 (six) hours as needed. 04/08/20   Wallis Bamberg, PA-C  triamcinolone cream (KENALOG) 0.1 % Apply 1 application topically 2 (two) times daily. 01/26/20   Myra Rude, MD    Allergies    Patient has no known allergies.  Review of Systems   Review of Systems  Physical Exam Updated Vital Signs BP (!) 162/94 (BP Location: Right Arm)   Pulse 79   Temp 97.8 F (36.6 C) (Oral)   Resp 19   Ht 1.702 m (5\' 7" )   Wt 90.7 kg   SpO2 100%   BMI 31.32 kg/m   Physical Exam  ED Results / Procedures / Treatments   Labs (all labs ordered are listed, but only abnormal results are displayed) Labs Reviewed  CBC WITH DIFFERENTIAL/PLATELET  COMPREHENSIVE METABOLIC PANEL  BRAIN NATRIURETIC PEPTIDE    EKG None  Radiology DG Chest 2 View  Result Date:  06/10/2020 CLINICAL DATA:  Shortness of breath EXAM: CHEST - 2 VIEW COMPARISON:  April 19, 2020 FINDINGS: The cardiomediastinal silhouette is unchanged and mildly enlarged in contour.Low lung volumes. Tortuous thoracic aorta. No pleural effusion. No pneumothorax. No acute pleuroparenchymal abnormality. Visualized abdomen is unremarkable. Multilevel degenerative changes of the thoracic spine. IMPRESSION: Low lung volumes without acute cardiopulmonary abnormality. Electronically Signed   By: April 21, 2020 MD   On: 06/10/2020 16:50    Procedures Procedures (including critical care time)  Medications Ordered in ED Medications - No data to display  ED Course  I have reviewed the triage vital signs and the nursing notes.  Pertinent labs & imaging results that were available during my care of the patient were reviewed by me and considered in my medical decision making (see chart for details).    MDM Rules/Calculators/A&P                          Final Clinical Impression(s) / ED Diagnoses Final diagnoses:  None    Rx / DC Orders ED Discharge Orders    None       08/10/2020, MD 06/11/20 [phone]

## 2020-06-10 NOTE — ED Triage Notes (Signed)
C/o cont bil leg swelling x 1 month increased confusion per son
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
## 2020-06-10 NOTE — ED Notes (Signed)
Presents with bilateral ankle swelling for the past month, denies having SOB or chest pain, states he is able to sleep at night, denies night time dyspnea, orthopnea or PND.

## 2020-06-10 NOTE — Discharge Instructions (Addendum)
Work-up here today for the bilateral lower extremity swelling.  Without any significant findings.  Doppler studies of both legs showed no evidence of deep vein thrombosis.  Kidney function is normal no evidence of any fluid in the lungs.  Would recommend starting taking Lasix each day 1 tablet as directed.  Keep your appointment to follow-up with your new primary care doctor additional work-up by them.  Safe for discharge home.

## 2020-06-13 ENCOUNTER — Emergency Department (HOSPITAL_COMMUNITY): Payer: Medicare Other

## 2020-06-13 ENCOUNTER — Inpatient Hospital Stay (HOSPITAL_COMMUNITY)
Admission: EM | Admit: 2020-06-13 | Discharge: 2020-06-17 | DRG: 885 | Disposition: A | Discharge status: Skilled Nursing Facility | Payer: Medicare Other | Attending: Internal Medicine | Admitting: Internal Medicine

## 2020-06-13 ENCOUNTER — Other Ambulatory Visit: Payer: Self-pay

## 2020-06-13 DIAGNOSIS — M48061 Spinal stenosis, lumbar region without neurogenic claudication: Secondary | ICD-10-CM | POA: Diagnosis present

## 2020-06-13 DIAGNOSIS — R262 Difficulty in walking, not elsewhere classified: Secondary | ICD-10-CM | POA: Diagnosis present

## 2020-06-13 DIAGNOSIS — I499 Cardiac arrhythmia, unspecified: Secondary | ICD-10-CM | POA: Diagnosis present

## 2020-06-13 DIAGNOSIS — Z79899 Other long term (current) drug therapy: Secondary | ICD-10-CM

## 2020-06-13 DIAGNOSIS — S065X9A Traumatic subdural hemorrhage with loss of consciousness of unspecified duration, initial encounter: Secondary | ICD-10-CM | POA: Diagnosis present

## 2020-06-13 DIAGNOSIS — E538 Deficiency of other specified B group vitamins: Secondary | ICD-10-CM | POA: Diagnosis present

## 2020-06-13 DIAGNOSIS — R531 Weakness: Secondary | ICD-10-CM | POA: Diagnosis present

## 2020-06-13 DIAGNOSIS — R29898 Other symptoms and signs involving the musculoskeletal system: Secondary | ICD-10-CM | POA: Diagnosis present

## 2020-06-13 DIAGNOSIS — I10 Essential (primary) hypertension: Secondary | ICD-10-CM | POA: Diagnosis present

## 2020-06-13 DIAGNOSIS — Z20822 Contact with and (suspected) exposure to covid-19: Secondary | ICD-10-CM | POA: Diagnosis present

## 2020-06-13 DIAGNOSIS — F4325 Adjustment disorder with mixed disturbance of emotions and conduct: Secondary | ICD-10-CM | POA: Diagnosis present

## 2020-06-13 DIAGNOSIS — R600 Localized edema: Secondary | ICD-10-CM | POA: Diagnosis present

## 2020-06-13 DIAGNOSIS — S065XAA Traumatic subdural hemorrhage with loss of consciousness status unknown, initial encounter: Secondary | ICD-10-CM | POA: Diagnosis present

## 2020-06-13 DIAGNOSIS — I6203 Nontraumatic chronic subdural hemorrhage: Secondary | ICD-10-CM | POA: Diagnosis not present

## 2020-06-13 DIAGNOSIS — Z5329 Procedure and treatment not carried out because of patient's decision for other reasons: Secondary | ICD-10-CM | POA: Diagnosis not present

## 2020-06-13 DIAGNOSIS — F319 Bipolar disorder, unspecified: Principal | ICD-10-CM | POA: Diagnosis present

## 2020-06-13 LAB — BASIC METABOLIC PANEL
Anion gap: 9 (ref 5–15)
BUN: 8 mg/dL (ref 8–23)
CO2: 23 mmol/L (ref 22–32)
Calcium: 9.2 mg/dL (ref 8.9–10.3)
Chloride: 104 mmol/L (ref 98–111)
Creatinine, Ser: 0.93 mg/dL (ref 0.61–1.24)
GFR, Estimated: >60 mL/min (ref >60)
Glucose, Bld: 107 mg/dL — ABNORMAL HIGH (ref 70–99)
Potassium: 3.9 mmol/L (ref 3.5–5.1)
Sodium: 136 mmol/L (ref 135–145)

## 2020-06-13 LAB — RESPIRATORY PANEL BY RT PCR (FLU A&B, COVID)
Influenza A by PCR: NEGATIVE
Influenza B by PCR: NEGATIVE
SARS Coronavirus 2 by RT PCR: NEGATIVE

## 2020-06-13 LAB — CBC
HCT: 40.7 % (ref 39.0–52.0)
Hemoglobin: 13.2 g/dL (ref 13.0–17.0)
MCH: 30.6 pg (ref 26.0–34.0)
MCHC: 32.4 g/dL (ref 30.0–36.0)
MCV: 94.4 fL (ref 80.0–100.0)
Platelets: 217 10*3/uL (ref 150–400)
RBC: 4.31 MIL/uL (ref 4.22–5.81)
RDW: 13.2 % (ref 11.5–15.5)
WBC: 5.3 10*3/uL (ref 4.0–10.5)
nRBC: 0 % (ref 0.0–0.2)

## 2020-06-13 MED ORDER — ENOXAPARIN SODIUM 40 MG/0.4ML ~~LOC~~ SOLN
40.0000 mg | SUBCUTANEOUS | Status: DC
  Administered 2020-06-13: 40 mg via SUBCUTANEOUS
  Filled 2020-06-13 (×2): qty 0.4
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
## 2020-06-13 NOTE — ED Notes (Signed)
Pt took bp cuff off, so that's the reason vs are not as frequent for acuity.
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
## 2020-06-13 NOTE — ED Notes (Signed)
Patient transported to X-ray 
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
## 2020-06-13 NOTE — ED Notes (Signed)
Pt has 1+ edema of LE bilat.

## 2020-06-13 NOTE — ED Notes (Signed)
Attempted to stand pt, pt was unable to stand.
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
## 2020-06-13 NOTE — ED Notes (Signed)
Patient transported to CT 

## 2020-06-14 DIAGNOSIS — R531 Weakness: Secondary | ICD-10-CM

## 2020-06-14 DIAGNOSIS — I10 Essential (primary) hypertension: Secondary | ICD-10-CM

## 2020-06-14 DIAGNOSIS — I6203 Nontraumatic chronic subdural hemorrhage: Secondary | ICD-10-CM

## 2020-06-14 DIAGNOSIS — F319 Bipolar disorder, unspecified: Principal | ICD-10-CM

## 2020-06-14 LAB — CBC
HCT: 39.4 % (ref 39.0–52.0)
Hemoglobin: 12.5 g/dL — ABNORMAL LOW (ref 13.0–17.0)
MCH: 29.9 pg (ref 26.0–34.0)
MCHC: 31.7 g/dL (ref 30.0–36.0)
MCV: 94.3 fL (ref 80.0–100.0)
Platelets: 225 10*3/uL (ref 150–400)
RBC: 4.18 MIL/uL — ABNORMAL LOW (ref 4.22–5.81)
RDW: 13.2 % (ref 11.5–15.5)
WBC: 5.5 10*3/uL (ref 4.0–10.5)
nRBC: 0 % (ref 0.0–0.2)

## 2020-06-14 LAB — BASIC METABOLIC PANEL
Anion gap: 8 (ref 5–15)
BUN: 10 mg/dL (ref 8–23)
CO2: 24 mmol/L (ref 22–32)
Calcium: 9.3 mg/dL (ref 8.9–10.3)
Chloride: 102 mmol/L (ref 98–111)
Creatinine, Ser: 0.87 mg/dL (ref 0.61–1.24)
GFR, Estimated: >60 mL/min (ref >60)
Glucose, Bld: 107 mg/dL — ABNORMAL HIGH (ref 70–99)
Potassium: 3.7 mmol/L (ref 3.5–5.1)
Sodium: 134 mmol/L — ABNORMAL LOW (ref 135–145)
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
## 2020-06-14 NOTE — ED Notes (Addendum)
Patient refused to have labs drawn .this morning report to nurse .

## 2020-06-14 NOTE — ED Notes (Signed)
Pt refusing morning labs. Pt states "I don't want to be a pin cushion." Educated patient of importance of morning labs. Pt still is refusing.

## 2020-06-14 NOTE — ED Notes (Signed)
Pt continues to press call bell and stating "I want someone to come in here to talk with me and I need my laundry." Attempted to reorient patient, but pt is unable to be re oriented. Pt has moments of lucidness.
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
## 2020-06-14 NOTE — Progress Notes (Addendum)
Civil engineer, contracting [facility])  Mr.Grants daughter called to inquire about hospice services and appropriateness for hospice once pt discharges.   [facility] will follow during hospitalization to determine what his needs may be closer to d/c date.  Please reach out if we can be of further assistance.    Wallis Bamberg RN, BSN, CCRN [facility] Liaison (liaisons in [place] under hospice)

## 2020-06-15 DIAGNOSIS — F4325 Adjustment disorder with mixed disturbance of emotions and conduct: Secondary | ICD-10-CM | POA: Diagnosis present

## 2020-06-15 MED ORDER — LORAZEPAM 1 MG PO TABS
1.0000 mg | ORAL_TABLET | Freq: Every evening | ORAL | Status: DC | PRN
  Administered 2020-06-15: 1 mg via ORAL
  Filled 2020-06-15: qty 1

## 2020-06-15 MED ORDER — DIVALPROEX SODIUM 250 MG PO DR TAB
500.0000 mg | DELAYED_RELEASE_TABLET | Freq: Two times a day (BID) | ORAL | Status: DC
  Administered 2020-06-15 – 2020-06-17 (×4): 500 mg via ORAL
  Filled 2020-06-15 (×4): qty 2

## 2020-06-15 NOTE — Consult Note (Signed)
[facility] Face-to-Face Psychiatry Consult   Reason for Consult:  Psychogenic weakness Referring Physician:  Dr Claudette Laws Patient Identification: Jerry Flores MRN:  [id number] Principal Diagnosis: Inability to walk Diagnosis:  Active Problems:   Adjustment disorder with mixed disturbance of emotions and conduct   Lower extremity weakness   Total Time spent with patient: 45 minutes  Subjective:   Jerry Flores is a 84 y.o. male patient admitted with leg weakness.  HPI:  Patient seen and evaluated in person by this provider.  Fixated on [place] delivery of a King sized bed to a [employer] that was not working for him.  Demanding to talk to [place] his fiance.  Assisted him to call her and hold the phone.  He did not want [place] to leave.  Specially requested we call his daughter, Jerry Flores, to settle the discord between her and his fiance.  Difficulty with phone, appears difficult to see at times.  On the phone, he was speaking loudly to his fiance, "This is life and death.  Step out of the damn store and talk to me."  Pause.  "I need $180 dollars, just pay it.  My children are not taking my money."  Difficult to redirect after phone call.  No suicidal/homicidal ideations, hallucinations, or recent substance use.  Medication recommendations below in plan.  Past Psychiatric History: bipolar d/o vs alcohol use d/o per history  Risk to Self:  none Risk to Others:  none Prior Inpatient Therapy:  none Prior Outpatient Therapy:  none  Past Medical History: No past medical history on file.  Past Surgical History:  Procedure Laterality Date   HERNIA REPAIR     Family History:  Family History  Family history unknown: Yes   Family Psychiatric  History: none Social History:  Social History   Substance and Sexual Activity  Alcohol Use Not Currently     Social History   Substance and Sexual Activity  Drug Use Not Currently    Social History   Socioeconomic History   Marital status: Married     Spouse name: Not on file   Number of children: Not on file   Years of education: Not on file   Highest education level: Not on file  Occupational History   Not on file  Tobacco Use   Smoking status: Never Smoker   Smokeless tobacco: Never Used  Vaping Use   Vaping Use: Never used  Substance and Sexual Activity   Alcohol use: Not Currently   Drug use: Not Currently   Sexual activity: Not on file  Other Topics Concern   Not on file  Social History Narrative   Not on file   Social Determinants of Health   Financial Resource Strain:    Difficulty of Paying Living Expenses: Not on file  Food Insecurity:    Worried About Running Out of Food in the Last Year: Not on file   Ran Out of Food in the Last Year: Not on file  Transportation Needs:    Lack of Transportation (Medical): Not on file   Lack of Transportation (Non-Medical): Not on file  Physical Activity:    Days of Exercise per Week: Not on file   Minutes of Exercise per Session: Not on file  Stress:    Feeling of Stress : Not on file  Social Connections:    Frequency of Communication with Friends and Family: Not on file   Frequency of Social Gatherings with Friends and Family: Not on file  Attends Religious Services: Not on file   Active Member of Clubs or Organizations: Not on file   Attends Club or Organization Meetings: Not on file   Marital Status: Not on file   Additional Social History:    Allergies:  No Known Allergies  Labs:  Results for orders placed or performed during the hospital encounter of 06/13/20 (from the past 48 hour(s))  Basic metabolic panel     Status: Abnormal   Collection Time: 06/14/20  4:17 AM  Result Value Ref Range   Sodium 134 (L) 135 - 145 mmol/L   Potassium 3.7 3.5 - 5.1 mmol/L   Chloride 102 98 - 111 mmol/L   CO2 24 22 - 32 mmol/L   Glucose, Bld 107 (H) 70 - 99 mg/dL    Comment: Glucose reference range applies only to samples taken after fasting  for at least 8 hours.   BUN 10 8 - 23 mg/dL   Creatinine, Ser 8.56 0.61 - 1.24 mg/dL   Calcium 9.3 8.9 - 31.4 mg/dL   GFR, Estimated >97 >02 mL/min   Anion gap 8 5 - 15    Comment: Performed at [facility], [address], [place], [place] [address]  CBC     Status: Abnormal   Collection Time: 06/14/20  4:17 AM  Result Value Ref Range   WBC 5.5 4.0 - 10.5 K/uL   RBC 4.18 (L) 4.22 - 5.81 MIL/uL   Hemoglobin 12.5 (L) 13.0 - 17.0 g/dL   HCT 88.5 39 - 52 %   MCV 94.3 80.0 - 100.0 fL   MCH 29.9 26.0 - 34.0 pg   MCHC 31.7 30.0 - 36.0 g/dL   RDW 02.7 74.1 - 28.7 %   Platelets 225 150 - 400 K/uL   nRBC 0.0 0.0 - 0.2 %    Comment: Performed at [facility], [address] [place], [place] [address]    No current facility-administered medications for this encounter.    Musculoskeletal: Strength & Muscle Tone: decreased Gait & Station: did not witness Patient leans: N/A  Psychiatric Specialty Exam: Physical Exam Vitals and nursing note reviewed.  Constitutional:      Appearance: Normal appearance.  HENT:     Head: Normocephalic.     Nose: Nose normal.  Pulmonary:     Effort: Pulmonary effort is normal.  Musculoskeletal:     Cervical back: Normal range of motion.  Neurological:     General: No focal deficit present.     Mental Status: He is alert and oriented to person, place, and time.  Psychiatric:        Attention and Perception: He is inattentive.        Mood and Affect: Mood is anxious.        Speech: Speech normal.        Behavior: Behavior is agitated.        Thought Content: Thought content normal.        Cognition and Memory: Cognition is impaired.        Judgment: Judgment is impulsive.     Review of Systems  Psychiatric/Behavioral: Positive for decreased concentration. The patient is nervous/anxious.   All other systems reviewed and are negative.   Blood pressure (!) 149/80, pulse 90, temperature 98.7 F (37.1 C), temperature source Oral,  resp. rate 18, SpO2 94 %.There is no height or weight on file to calculate BMI.  General Appearance: Casual  Eye Contact:  Fair  Speech:  Normal Rate  Volume:  Increased  Mood:  Anxious and Irritable  Affect:  Congruent  Thought Process:  Coherent and Descriptions of Associations: Circumstantial  Orientation:  Full (Time, Place, and Person)  Thought Content:  Illogical and Obsessions  Suicidal Thoughts:  No  Homicidal Thoughts:  No  Memory:  Immediate;   Fair Recent;   Fair Remote;   Fair  Judgement:  Fair  Insight:  Fair  Psychomotor Activity:  Decreased  Concentration:  Concentration: Fair and Attention Span: Fair  Recall:  [employer] of Knowledge:  Fair  Language:  Good  Akathisia:  No  Handed:  Right  AIMS (if indicated):     Assets:  Leisure Time Resilience Social Support  ADL's:  Impaired  Cognition:  Impaired,  Mild  Sleep:        Treatment Plan Summary: Adjustment disorder with mixed disturbance of emotion and conduct vs bipolar d/o: -Recommend Depakote 500 mg BID -Recommend Ativan 1 mg at bedtime PRN agitation  Disposition: No evidence of imminent risk to self or others at present.   Patient does not meet criteria for psychiatric inpatient admission.  Nanine Means, NP 06/15/2020 3:36 PM

## 2020-06-15 NOTE — Progress Notes (Signed)
Jerry Flores admitted with the above diagnoses and presents with below problem list. Jerry Flores will benefit from continued acute OT to address the below listed deficits and maximize independence with basic ADLs prior to d/c to venue below. Unclear PLOF with ADLs but per chart review Jerry Flores was at ambulatory PTA. No family present and Jerry Flores unable to give reliable history. Jerry Flores currently unable to clear hips from EOB when attempting to stand with +2 max A and rw. Min to mod A with UB ADLs, max - total A with LB ADLs, max +2 A with bed mobility.   Jerry Flores, OT Acute Rehabilitation Services Pager: [phone] Office: [phone]      06/15/20 1100  OT Visit Information  Last OT Received On 06/15/20  Assistance Needed +2  History of Present Illness 84 yo male with onset of nonambulatory status from a convoluted history from Jerry Flores, apparently in the last three weeks.  Jerry Flores reports events in a different frame than the chart history he gave previously, but is clear he has fallen and now has worsening B SDH's.  Jerry Flores asking for rehab placement.  PMHx:  B SDH's, bipolar disorder, HTN, multilevel spondylosis of lumbar spine, foraminal stenosis L4-5, L5-S1, atherosclerosis,   Precautions  Precautions Fall;Back  Precaution Comments has chronic back changes with no acute findings  Restrictions  Weight Bearing Restrictions No  Other Position/Activity Restrictions elevation of head due to [facility]  Home Living  Family/patient expects to be discharged to: Private residence  Living Arrangements Alone;Non-relatives/Friends  Available Help at Discharge Friend(s);Family;Available 24 hours/day  Type of Home House  Home Access Level entry  Home Layout One level  Engineer, water - 2 wheels  Prior Function  Level of Independence Independent with assistive device(s)  Comments per chart review and Jerry Flores report was ambulatory until recently. Unclear baseline with ADLs. No family present to provide PLOF data.    Communication  Communication No difficulties  Pain Assessment  Pain Assessment Faces  Cognition  Arousal/Alertness Awake/alert  Behavior During Therapy Impulsive;Agitated  Overall Cognitive Status No family/caregiver present to determine baseline cognitive functioning  General Comments verbal perseveration on singular topic throughout session, difficulty redirecting. Follows one steps commands, Focused attention level. Jerry Flores attempting to provide PLOF data but is currently not a reliable historian. Does state at times that he knows he is not thinking clearly and that he is not moving the way he normally would.   Upper Extremity Assessment  Upper Extremity Assessment Generalized weakness  Lower Extremity Assessment  Lower Extremity Assessment Defer to Jerry Flores evaluation  ADL  Overall ADL's  Needs assistance/impaired  Eating/Feeding Minimal assistance;Bed level  Grooming Maximal assistance;Bed level;Sitting  Upper Body Bathing Maximal assistance;Sitting  Lower Body Bathing Total assistance;+2 for physical assistance;Bed level;Sitting/lateral leans  Upper Body Dressing  Maximal assistance;Sitting;Bed level  Lower Body Dressing Total assistance;+2 for physical assistance;Sitting/lateral leans;Bed level  General ADL Comments Jerry Flores completed bed mobility, sat EOB about 5 minutes. Attempted STS 2x with Jerry Flores unable to clear hips from EOB with +2 max A.   Bed Mobility  Overal bed mobility Needs Assistance  Bed Mobility Supine to Sit;Sit to Supine  Supine to sit Max assist  Sit to supine Total assist;+2 for physical assistance  Transfers  Overall transfer level Needs assistance  General transfer comment attempted 2x from EOB with rw and +2 max A. Jerry Flores unable to clear hips.  Balance  Overall balance assessment Needs assistance;History of Falls  Sitting-balance support Feet supported;Bilateral upper extremity supported  Sitting balance-Leahy Scale Poor  Sitting balance - Comments fatigues into posterior  lean  OT - End of Session  Equipment Utilized During Treatment Gait belt;Rolling walker  Activity Tolerance Patient limited by fatigue;Patient tolerated treatment well  Patient left in bed;with call bell/phone within reach;with bed alarm set  OT Assessment  OT Recommendation/Assessment Patient needs continued OT Services  OT Visit Diagnosis Other abnormalities of gait and mobility (R26.89);Muscle weakness (generalized) (M62.81);Other symptoms and signs involving cognitive function;Pain  OT Problem List Decreased strength;Decreased range of motion;Decreased activity tolerance;Impaired balance (sitting and/or standing);Decreased cognition;Decreased safety awareness;Decreased knowledge of use of DME or AE;Decreased knowledge of precautions;Pain;Impaired UE functional use  OT Plan  OT Frequency (ACUTE ONLY) Min 2X/week  OT Treatment/Interventions (ACUTE ONLY) Self-care/ADL training;Therapeutic exercise;Neuromuscular education;Energy conservation;DME and/or AE instruction;Therapeutic activities;Cognitive remediation/compensation;Patient/family education;Balance training  AM-PAC OT "6 Clicks" Daily Activity Outcome Measure (Version 2)  Help from another person eating meals? 3  Help from another person taking care of personal grooming? 2  Help from another person toileting, which includes using toliet, bedpan, or urinal? 1  Help from another person bathing (including washing, rinsing, drying)? 1  Help from another person to put on and taking off regular upper body clothing? 2  Help from another person to put on and taking off regular lower body clothing? 1  6 Click Score 10  OT Recommendation  Follow Up Recommendations SNF  OT Equipment Other (comment) (defer to next venue)  Individuals Consulted  Consulted and Agree with Results and Recommendations Patient  Acute Rehab OT Goals  Patient Stated Goal to go to rehab  OT Goal Formulation With patient  Time For Goal Achievement 06/29/20  Potential  to Achieve Goals Good  OT Time Calculation  OT Start Time (ACUTE ONLY) 1134  OT Stop Time (ACUTE ONLY) 1154  OT Time Calculation (min) 20 min  OT General Charges  $OT Visit 1 Visit  OT Evaluation  $OT Eval Moderate Complexity 1 Mod  Written Expression  Dominant Hand Right

## 2020-06-15 NOTE — TOC Initial Note (Signed)
Transition of Care [facility]) - Initial/Assessment Note    Patient Details  Name: Jerry Flores MRN: [id number] Date of Birth: 1936-05-18  Transition of Care [facility]) CM/SW Contact:    Baldemar Lenis, LCSW Phone Number: 06/15/2020, 4:44 PM  Clinical Narrative:   CSW spoke with daughter, Cala Bradford, earlier today to discuss SNF. Daughter agreeable to SNF. CSW faxed out referral, and spoke with daughter again this afternoon to provide bed offers. CSW emailed CMS Choice list and will follow up with daughter for SNF choice.                 Expected Discharge Plan: Skilled Nursing Facility Barriers to Discharge: Insurance Authorization, Continued Medical Work up   Patient Goals and CMS Choice Patient states their goals for this hospitalization and ongoing recovery are:: patient unable to participate in goal setting due to disorientation CMS Medicare.gov Compare Post Acute Care list provided to:: Patient Represenative (must comment) Choice offered to / list presented to : Adult Children  Expected Discharge Plan and Services Expected Discharge Plan: Skilled Nursing Facility     Post Acute Care Choice: Skilled Nursing Facility Living arrangements for the past 2 months: Single Family Home                                      Prior Living Arrangements/Services Living arrangements for the past 2 months: Single Family Home Lives with:: Self Patient language and need for interpreter reviewed:: No Do you feel safe going back to the place where you live?: Yes      Need for Family Participation in Patient Care: Yes (Comment) Care giver support system in place?: No (comment)   Criminal Activity/Legal Involvement Pertinent to Current Situation/Hospitalization: No - Comment as needed  Activities of Daily Living      Permission Sought/Granted Permission sought to share information with : Facility Medical sales representative, Family Supports Permission granted to share information with : Yes,  Verbal Permission Granted  Share Information with NAME: Cala Bradford  Permission granted to share info w AGENCY: SNF  Permission granted to share info w Relationship: Daughter     Emotional Assessment   Attitude/Demeanor/Rapport: Unable to Assess Affect (typically observed): Unable to Assess Orientation: : Oriented to Self Alcohol / Substance Use: Not Applicable Psych Involvement: Yes (comment)  Admission diagnosis:  Lower extremity weakness [R29.898] Weakness of both lower extremities [R29.898] Patient Active Problem List   Diagnosis Date Noted  . Adjustment disorder with mixed disturbance of emotions and conduct 06/15/2020  . Lower extremity weakness 06/13/2020   PCP:  Pcp, No Pharmacy:   [facility] [phone] - [place], [place] - [address] AT [facility] [address] [place] [address] Phone: [phone] Fax: [phone]     Social Determinants of Health (SDOH) Interventions    Readmission Risk Interventions No flowsheet data found.

## 2020-06-15 NOTE — Progress Notes (Addendum)
HD#2 Subjective:   No acute events overnight.  Evaluated at bedside during rounds this morning. Patient asks about denture location, point out to him they are in pink case on bedside table. Patient states he is feeling well, states that coming to [facility] is "good in his life." Is happy with the staff. Has no complaints. When told psychiatry would come to evaluate him, he states, "whatever you think is best."  Objective:  Vital signs in last 24 hours: Vitals:   06/14/20 2326 06/15/20 0409 06/15/20 0848 06/15/20 1158  BP: (!) 133/94 (!) 149/77 (!) 152/83 (!) 149/80  Pulse: 96 78 87 90  Resp: 18 17 18 18   Temp: 98.6 F (37 C) 97.8 F (36.6 C) 98.9 F (37.2 C) 98.7 F (37.1 C)  TempSrc:  Oral  Oral  SpO2: 97% 97% 93% 94%   Constitutional: very pleasant, well-appearing gentleman lying in bed, in no acute distress. HENT: normocephalic atraumatic Cardiovascular: regular rate and rhythm, no m/r/g Pulmonary/Chest: normal work of breathing on room air, lungs clear to auscultation bilaterally Abdominal: soft, non-tender, non-distended Neurological: alert & oriented x 3, 5/5 strength in bilateral upper extremities, 5/5 strength with hip flexion, dorsi and plantarflexion bilaterally, when asked to sit up on the side of the bed, the patient seems confused with directions Psych: Patient continues to be rambling and circumferential  Pertinent Labs: CBC Latest Ref Rng & Units 06/14/2020 06/13/2020 06/10/2020  WBC 4.0 - 10.5 K/uL 5.5 5.3 5.2  Hemoglobin 13.0 - 17.0 g/dL 12.5(L) 13.2 12.7(L)  Hematocrit 39 - 52 % 39.4 40.7 38.8(L)  Platelets 150 - 400 K/uL 225 217 193    CMP Latest Ref Rng & Units 06/14/2020 06/13/2020 06/10/2020  Glucose 70 - 99 mg/dL 08/10/2020) 856(D) 149(F)  BUN 8 - 23 mg/dL 10 8 13   Creatinine 0.61 - 1.24 mg/dL 026(V 7.85  Sodium 135 - 145 mmol/L 134(L) 136 135  Potassium 3.5 - 5.1 mmol/L 3.7 3.9 3.4(L)  Chloride 98 - 111 mmol/L 102 104 102  CO2 22 - 32  mmol/L 24 23 24   Calcium 8.9 - 10.3 mg/dL 9.3 9.2 8.85)  Total Protein 6.5 - 8.1 g/dL - - 7.5  Total Bilirubin 0.3 - 1.2 mg/dL - - 0.5  Alkaline Phos 38 - 126 U/L - - 68  AST 15 - 41 U/L - - 42(H)  ALT 0 - 44 U/L - - 26    Assessment/Plan:   Active Problems:   Lower extremity weakness   Adjustment disorder with mixed disturbance of emotions and conduct   Patient Summary:  Jerry Flores is an 84 year old man with reported history of bipolar disorder who presents with chief complaint of bilateral lower extremity weakness and found to have large bilateral hemispheric subdural hematomas. Admitted for neurosurgical consult and further work-up.  This is hospital day 2.  Bilateral lower extremity weakness Bilateral hemispheric subdural hematomas Adjustment disorder with mixed disturbance of emotion and conduct vs bipolar disorder Patient evaluated by neurosurgery yesterday evening who note that bilateral SDH are chronic in nature and do not correlate to the patient's complaints. Patient is pleasant however continues to be difficult to redirect. On exam this morning, able to elicit 5/5 strength in bilateral lower extremities, however patient seems confused when instructed to try to sit on the edge of the bed. PT/OT recommend SNF. Patient evaluated by psychiatry who recommend initiation of Depakote 500 mg twice daily for mood stabilization and Ativan 1 mg at bedtime as  needed for agitation. Patient does not meet criteria for inpatient psychiatric care.  - Psychiatry consulted, appreciate recs  - Start Depakote 500 mg twice daily  - Start lorazepam 1 mg at bedtime PRN  Hypertension Patient's BP 110s-160s/70s-90s. No known history of hypertension, though patient does not seem to have a PCP or continuity in his medical care. - Continue to monitor - Consider amlodipine 5 mg daily    Diet: Heart Healthy IVF: None,None VTE: SCDs Code: Full PT/OT recs: SNF   Alphonzo Severance, MD Internal  Medicine Resident PGY-1 Pager [phone] Please contact the on call pager after 5 pm and on weekends at [phone].

## 2020-06-15 NOTE — NC FL2 (Signed)
  [place] MEDICAID FL2 LEVEL OF CARE SCREENING TOOL     IDENTIFICATION  Patient Name: Jerry Flores Birthdate: Feb 26, 1936 Sex: male Admission Date (Current Location): 06/13/2020  [facility] and [place] Number:  Producer, television/film/video and Address:  [facility] [place], [address] [place], [place] [address]      Provider Number: [id number]  Attending Physician Name and Address:  Gust Rung, DO  Relative Name and Phone Number:       Current Level of Care: Hospital Recommended Level of Care: Skilled Nursing Facility Prior Approval Number:    Date Approved/Denied:   PASRR Number: [id number]  Discharge Plan: SNF    Current Diagnoses: Patient Active Problem List   Diagnosis Date Noted  . Lower extremity weakness 06/13/2020    Orientation RESPIRATION BLADDER Height & Weight     Self, Place  Normal Incontinent Weight:   Height:     BEHAVIORAL SYMPTOMS/MOOD NEUROLOGICAL BOWEL NUTRITION STATUS      Incontinent Diet (heart healthy)  AMBULATORY STATUS COMMUNICATION OF NEEDS Skin   Extensive Assist Verbally Normal                       Personal Care Assistance Level of Assistance  Bathing, Feeding, Dressing Bathing Assistance: Maximum assistance Feeding assistance: Limited assistance Dressing Assistance: Maximum assistance     Functional Limitations Info  Sight, Hearing, Speech Sight Info: Adequate Hearing Info: Adequate Speech Info: Adequate    SPECIAL CARE FACTORS FREQUENCY  PT (By licensed PT), OT (By licensed OT)     PT Frequency: 5x/wk OT Frequency: 5x/wk            Contractures Contractures Info: Not present    Additional Factors Info  Code Status, Allergies Code Status Info: Full Allergies Info: NKA           Current Medications (06/15/2020):  This is the current hospital active medication list No current facility-administered medications for this encounter.     Discharge Medications: Please see discharge  summary for a list of discharge medications.  Relevant Imaging Results:  Relevant Lab Results:   Additional Information SS#: [id number]  Baldemar Lenis, LCSW

## 2020-06-16 DIAGNOSIS — F4325 Adjustment disorder with mixed disturbance of emotions and conduct: Secondary | ICD-10-CM

## 2020-06-16 DIAGNOSIS — S065XAA Traumatic subdural hemorrhage with loss of consciousness status unknown, initial encounter: Secondary | ICD-10-CM | POA: Diagnosis present

## 2020-06-16 DIAGNOSIS — S065X9A Traumatic subdural hemorrhage with loss of consciousness of unspecified duration, initial encounter: Secondary | ICD-10-CM | POA: Diagnosis present

## 2020-06-16 LAB — SARS CORONAVIRUS 2 BY RT PCR (HOSPITAL ORDER, PERFORMED IN ~~LOC~~ HOSPITAL LAB): SARS Coronavirus 2: NEGATIVE

## 2020-06-16 MED ORDER — AMLODIPINE BESYLATE 5 MG PO TABS
5.0000 mg | ORAL_TABLET | Freq: Every day | ORAL | Status: DC
  Administered 2020-06-16 – 2020-06-17 (×2): 5 mg via ORAL
  Filled 2020-06-16 (×2): qty 1

## 2020-06-16 MED ORDER — POLYETHYLENE GLYCOL 3350 17 G PO PACK
17.0000 g | PACK | Freq: Every day | ORAL | Status: DC
  Administered 2020-06-16 – 2020-06-17 (×2): 17 g via ORAL
  Filled 2020-06-16 (×2): qty 1

## 2020-06-16 NOTE — Progress Notes (Signed)
Physical Therapy Treatment Patient Details Name: Jerry Flores MRN: [id number] DOB: 05/06/1936 Today's Date: 06/16/2020    History of Present Illness 84 yo male with onset of nonambulatory status from a convoluted history from pt, apparently in the last three weeks.  Pt reports events in a different frame than the chart history he gave previously, but is clear he has fallen and now has worsening B SDH's.  Pt asking for rehab placement.  PMHx:  B SDH's, bipolar disorder, HTN, multilevel spondylosis of lumbar spine, foraminal stenosis L4-5, L5-S1, atherosclerosis,     PT Comments    Patient received in bed, very confused but agreeable to therapy; continues to require Max-totalAx2 for all aspects of functional mobility today. Had strong R and posterior lean in both sitting and standing today with difficulty correcting today. Had a very difficult time standing even in stedy, and once in stedy perseverated on his knees being sore from the anterior blocking pad in the front of the stedy, difficult to redirect and while able to stand again with totalAx2 and multiple attempts, almost needed 3 people to get out of stedy. Left positioned to comfort in recliner with all needs met, chair alarm active, and RN aware of patient status/need for maximove for back to bed.     Follow Up Recommendations  SNF;Supervision/Assistance - 24 hour     Equipment Recommendations  None recommended by PT (defer to next venue)    Recommendations for Other Services       Precautions / Restrictions Precautions Precautions: Fall Restrictions Weight Bearing Restrictions: No    Mobility  Bed Mobility Overal bed mobility: Needs Assistance Bed Mobility: Supine to Sit     Supine to sit: Max assist;+2 for physical assistance     General bed mobility comments: MaxAx2 to get to EOB with Max VC/TC along with extended time for processing  Transfers Overall transfer level: Needs assistance Equipment used: Ambulation  equipment used Transfers: Sit to/from [facility] Sit to Stand: Total assist;+2 physical assistance;From elevated surface Stand pivot transfers: Total assist;+2 physical assistance       General transfer comment: totalAx2 to come to full standing position, very little initiation from patient even with cues; totalA pivot to chair in stedy  Ambulation/Gait             General Gait Details: unable   Stairs             Wheelchair Mobility    Modified Rankin (Stroke Patients Only)       Balance Overall balance assessment: Needs assistance;History of Falls Sitting-balance support: Feet supported;Bilateral upper extremity supported Sitting balance-Leahy Scale: Poor Sitting balance - Comments: posterior and heavy right lean Postural control: Posterior lean;Right lateral lean Standing balance support: Bilateral upper extremity supported;During functional activity Standing balance-Leahy Scale: Zero Standing balance comment: heavy right and posterior lean in stedy                            Cognition Arousal/Alertness: Awake/alert Behavior During Therapy: Impulsive;Agitated Overall Cognitive Status: No family/caregiver present to determine baseline cognitive functioning                                 General Comments: very easily distracted, once in stedy perseverated on his knees pressing on the block plate and difficult to redirect; followed cues perhaps 20% of the time  Exercises      General Comments        Pertinent Vitals/Pain Pain Assessment: Faces Faces Pain Scale: No hurt Pain Intervention(s): Monitored during session    Home Living                      Prior Function            PT Goals (current goals can now be found in the care plan section) Acute Rehab PT Goals Patient Stated Goal: to go to rehab PT Goal Formulation: With patient Time For Goal Achievement: 06/28/20 Potential to  Achieve Goals: Good Progress towards PT goals: Progressing toward goals    Frequency    Min 3X/week      PT Plan Current plan remains appropriate    Co-evaluation              AM-PAC PT "6 Clicks" Mobility   Outcome Measure  Help needed turning from your back to your side while in a flat bed without using bedrails?: A Lot Help needed moving from lying on your back to sitting on the side of a flat bed without using bedrails?: A Lot Help needed moving to and from a bed to a chair (including a wheelchair)?: Total Help needed standing up from a chair using your arms (e.g., wheelchair or bedside chair)?: Total Help needed to walk in hospital room?: Total Help needed climbing 3-5 steps with a railing? : Total 6 Click Score: 8    End of Session Equipment Utilized During Treatment: Gait belt Activity Tolerance: Other (comment) (unclear what is causing issues) Patient left: with call bell/phone within reach;in chair;with chair alarm set Nurse Communication: Mobility status PT Visit Diagnosis: Other abnormalities of gait and mobility (R26.89);Difficulty in walking, not elsewhere classified (R26.2);History of falling (Z91.81)     Time: 1435-1510 PT Time Calculation (min) (ACUTE ONLY): 35 min  Charges:  $Therapeutic Activity: 23-37 mins                     Windell Norfolk, DPT, PN1   Supplemental Physical Therapist [place]    Pager [phone] Acute Rehab Office [phone]

## 2020-06-16 NOTE — Progress Notes (Signed)
HD#3 Subjective:   No acute events overnight.  Patient states he rested well overnight. Appreciates the recommendations from psychiatry. Looks forward to working with physical therapy more here and in a rehab facility.  Objective:  Vital signs in last 24 hours: Vitals:   06/15/20 1158 06/15/20 1945 06/15/20 2343 06/16/20 0354  BP: (!) 149/80 (!) 160/80 (!) 151/68 (!) 159/77  Pulse: 90 82 85 68  Resp: 18 19 18 18   Temp: 98.7 F (37.1 C) (!) 97.5 F (36.4 C) 98.7 F (37.1 C) (!) 97.5 F (36.4 C)  TempSrc: Oral Oral  Oral  SpO2: 94% 97% 97% 99%   Constitutional: very pleasant, well-appearing gentleman lying in bed, in no acute distress. HENT: normocephalic atraumatic Cardiovascular: regular rate and rhythm, no m/r/g Pulmonary/Chest: normal work of breathing on room air, lungs clear to auscultation bilaterally Abdominal: soft, non-tender, non-distended Neurological: alert & oriented x 3, 5/5 strength in bilateral upper extremities, 5/5 strength with hip flexion, when asked to move legs independently, patient moves them together Psych: Patient continues to have circumferential speech  Pertinent Labs: CBC Latest Ref Rng & Units 06/14/2020 06/13/2020 06/10/2020  WBC 4.0 - 10.5 K/uL 5.5 5.3 5.2  Hemoglobin 13.0 - 17.0 g/dL 12.5(L) 13.2 12.7(L)  Hematocrit 39 - 52 % 39.4 40.7 38.8(L)  Platelets 150 - 400 K/uL 225 217 193    CMP Latest Ref Rng & Units 06/14/2020 06/13/2020 06/10/2020  Glucose 70 - 99 mg/dL 08/10/2020) 921(J) 941(D)  BUN 8 - 23 mg/dL 10 8 13   Creatinine 0.61 - 1.24 mg/dL 408(X 4.48  Sodium 135 - 145 mmol/L 134(L) 136 135  Potassium 3.5 - 5.1 mmol/L 3.7 3.9 3.4(L)  Chloride 98 - 111 mmol/L 102 104 102  CO2 22 - 32 mmol/L 24 23 24   Calcium 8.9 - 10.3 mg/dL 9.3 9.2 1.85)  Total Protein 6.5 - 8.1 g/dL - - 7.5  Total Bilirubin 0.3 - 1.2 mg/dL - - 0.5  Alkaline Phos 38 - 126 U/L - - 68  AST 15 - 41 U/L - - 42(H)  ALT 0 - 44 U/L - - 26    Assessment/Plan:     Active Problems:   Lower extremity weakness   Adjustment disorder with mixed disturbance of emotions and conduct   Patient Summary:  [name] is an 84 year old man with reported history of bipolar disorder who presents with chief complaint of bilateral lower extremity weakness and found to have large bilateral hemispheric subdural hematomas. Admitted for neurosurgical consult and further work-up.  This is hospital day 3.  Bilateral lower extremity weakness Bilateral hemispheric subdural hematomas Adjustment disorder with mixed disturbance of emotion and conduct vs bipolar disorder Patient evaluated by neurosurgery who note that bilateral SDH are chronic in nature and do not correlate to the patient's complaints. Patient is pleasant however continues to be difficult to redirect. On exam demonstrates 5/5 strength throughout if distracted with talking.  Patient evaluated by psychiatry, appreciate recs below.  Patient does not meet criteria for inpatient psychiatric care. PT/OT recommend SNF. Discharge pending SNF placement. - COVID swab - Psychiatry consulted, appreciate recs  - Depakote 500 mg twice daily  - Lorazepam 1 mg at bedtime PRN  Hypertension Patient's BP 110s-160s/70s-90s. No known history of hypertension, though patient does not seem to have a PCP or continuity in his medical care. - Continue to monitor - Start amlodipine 5 mg daily    Diet: Heart Healthy VTE: SCDs Code: Full PT/OT recs:  SNF   Alphonzo Severance, MD Internal Medicine Resident PGY-1 Pager [phone] Please contact the on call pager after 5 pm and on weekends at [phone].

## 2020-06-16 NOTE — Progress Notes (Signed)
Per med residency team patient ok to not have an IV.

## 2020-06-16 NOTE — Care Management Important Message (Signed)
Important Message  Patient Details  Name: Chidiebere Wynn MRN: [id number] Date of Birth: 07-14-1936   Medicare Important Message Given:  Yes     Dorena Bodo 06/16/2020, 3:13 PM

## 2020-06-17 ENCOUNTER — Other Ambulatory Visit: Payer: Self-pay | Admitting: Student

## 2020-06-17 DIAGNOSIS — M48061 Spinal stenosis, lumbar region without neurogenic claudication: Secondary | ICD-10-CM

## 2020-06-17 MED ORDER — LORAZEPAM 1 MG PO TABS: 1.0000 mg | ORAL_TABLET | Freq: Every evening | ORAL | 0 refills | Status: AC | PRN

## 2020-06-17 MED ORDER — DIVALPROEX SODIUM 500 MG PO DR TAB: 500.0000 mg | DELAYED_RELEASE_TABLET | Freq: Two times a day (BID) | ORAL | 0 refills | Status: AC

## 2020-06-17 MED ORDER — AMLODIPINE BESYLATE 5 MG PO TABS: 5.0000 mg | ORAL_TABLET | Freq: Every day | ORAL | 0 refills | Status: AC

## 2020-06-17 NOTE — Progress Notes (Signed)
Nsg Discharge Note  Admit Date:  06/13/2020 Discharge date: 06/17/2020   Nas Wafer to be D/C'd . Patient/caregiver able to verbalize understanding.  Discharge Medication: Allergies as of 06/17/2020   No Known Allergies     Medication List    STOP taking these medications   furosemide 20 MG tablet Commonly known as: Lasix     TAKE these medications   amLODipine 5 MG tablet Commonly known as: NORVASC Take 1 tablet (5 mg total) by mouth daily. Start taking on: June 18, 2020   divalproex 500 MG DR tablet Commonly known as: DEPAKOTE Take 1 tablet (500 mg total) by mouth every 12 (twelve) hours.   LORazepam 1 MG tablet Commonly known as: ATIVAN Take 1 tablet (1 mg total) by mouth at bedtime as needed (agitation).   methocarbamol 500 MG tablet Commonly known as: ROBAXIN Take 1 tablet (500 mg total) by mouth every 8 (eight) hours as needed for muscle spasms.   traMADol 50 MG tablet Commonly known as: ULTRAM Take 1 tablet (50 mg total) by mouth every 6 (six) hours as needed.   triamcinolone cream 0.1 % Commonly known as: KENALOG Apply 1 application topically 2 (two) times daily.       Discharge Assessment: Vitals:   06/17/20 0850 06/17/20 1130  BP: 135/80 129/77  Pulse: 79 78  Resp:  18  Temp: 98 F (36.7 C) 98.5 F (36.9 C)  SpO2:  100%   Skin clean, dry and intact without evidence of skin break down, no evidence of skin tears noted. IV catheter discontinued intact. Site without signs and symptoms of complications - no redness or edema noted at insertion site, patient denies c/o pain - only slight tenderness at site.  Dressing with slight pressure applied.  D/c Instructions-Education: Discharge instructions given to patient/family with verbalized understanding. D/c education completed with patient/family including follow up instructions, medication list, d/c activities limitations if indicated, with other d/c instructions as indicated by MD - patient able  to verbalize understanding, all questions fully answered. Patient instructed to return to ED, call 911, or call MD for any changes in condition.  Patient escorted PTAR  Sharrieff Spratlin, Tilford Pillar, RN 06/17/2020 1:32 PM

## 2020-06-17 NOTE — TOC Progression Note (Signed)
Transition of Care [facility]) - Progression Note    Patient Details  Name: Jerry Flores MRN: [id number] Date of Birth: 1935/09/05  Transition of Care [facility]) CM/SW Contact  Baldemar Lenis, [place] Phone Number: 06/17/2020, 11:30 AM  Clinical Narrative:   CSW spoke with daughter about bed offers, and daughter chose [facility]. CSW sent in request to [facility] Medicare for insurance approval. [facility] has bed available when insurance auth received. COVID test already ordered. CSW to follow.    Expected Discharge Plan: Skilled Nursing Facility Barriers to Discharge: English as a second language teacher, Continued Medical Work up  Expected Discharge Plan and Services Expected Discharge Plan: Skilled Nursing Facility     Post Acute Care Choice: Skilled Nursing Facility Living arrangements for the past 2 months: Single Family Home Expected Discharge Date: 06/17/20                                     Social Determinants of Health (SDOH) Interventions    Readmission Risk Interventions No flowsheet data found.

## 2020-06-17 NOTE — TOC Transition Note (Addendum)
Transition of Care [facility]) - CM/SW Discharge Note   Patient Details  Name: Jerry Flores MRN: [id number] Date of Birth: 1935/11/21  Transition of Care [facility]) CM/SW Contact:  Baldemar Lenis, LCSW Phone Number: 06/17/2020, 11:58 AM   Clinical Narrative:   Nurse to call report to [phone], Room 116.  Pickup requested for 1:00 pm    Final next level of care: Skilled Nursing Facility Barriers to Discharge: Barriers Resolved   Patient Goals and CMS Choice Patient states their goals for this hospitalization and ongoing recovery are:: patient unable to participate in goal setting due to disorientation CMS Medicare.gov Compare Post Acute Care list provided to:: Patient Represenative (must comment) Choice offered to / list presented to : Adult Children  Discharge Placement              Patient chooses bed at:  ([facility]) Patient to be transferred to facility by: PTAR Name of family member notified: Cala Bradford Patient and family notified of of transfer: 06/17/20  Discharge Plan and Services     Post Acute Care Choice: Skilled Nursing Facility                               Social Determinants of Health (SDOH) Interventions     Readmission Risk Interventions No flowsheet data found.
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
## 2020-06-17 NOTE — Plan of Care (Signed)
Patient to be discharged to SNF today

## 2020-06-17 NOTE — Progress Notes (Signed)
HD#4 Subjective:   No acute events overnight.  Resting in bed upon examination. Notes that he is upset as his food/drinks are on his bedside tray far away from the bed. Patient also states at times he is unable to find his call buttons and if he does push the call button, it takes a while for a response to occur. He also notes his motion is decreased by the cold in the room. Adjusted patient's air conditioning to 72 degrees. Feels like he is in a "gully" due to bed positioning. Patient also notes this is the first time he is ill and he is not used to this lifestyle. He feels isolated when the door is closed. Reassurance provided. Instructions regarding using his call button provided/demonstrated. Additional blanket provided.  Objective:  Vital signs in last 24 hours: Vitals:   06/16/20 1602 06/16/20 1946 06/16/20 2326 06/17/20 0326  BP: 136/83 (!) 146/81 (!) 144/79 (!) 141/77  Pulse: 93 97 91 94  Resp: 20 18 18 18   Temp: 97.6 F (36.4 C) 98.6 F (37 C) 98.4 F (36.9 C) 98.1 F (36.7 C)  TempSrc: Oral Oral Oral Oral  SpO2: 94% 97% 98% 99%   Constitutional: well-appearing gentleman lying in bed, in no acute distress HENT: normocephalic atraumatic Cardiovascular: regular rate and rhythm, no m/r/g Neurological: alert & oriented x 3, moves all limbs spontaneously Psych: Patient appears confused regarding manipulating the call button on his remote  Pertinent Labs: CBC Latest Ref Rng & Units 06/14/2020 06/13/2020 06/10/2020  WBC 4.0 - 10.5 K/uL 5.5 5.3 5.2  Hemoglobin 13.0 - 17.0 g/dL 12.5(L) 13.2 12.7(L)  Hematocrit 39 - 52 % 39.4 40.7 38.8(L)  Platelets 150 - 400 K/uL 225 217 193    CMP Latest Ref Rng & Units 06/14/2020 06/13/2020 06/10/2020  Glucose 70 - 99 mg/dL 08/10/2020) 564(P) 329(J)  BUN 8 - 23 mg/dL 10 8 13   Creatinine 0.61 - 1.24 mg/dL 188(C 1.66  Sodium 135 - 145 mmol/L 134(L) 136 135  Potassium 3.5 - 5.1 mmol/L 3.7 3.9 3.4(L)  Chloride 98 - 111 mmol/L 102 104  102  CO2 22 - 32 mmol/L 24 23 24   Calcium 8.9 - 10.3 mg/dL 9.3 9.2 0.63)  Total Protein 6.5 - 8.1 g/dL - - 7.5  Total Bilirubin 0.3 - 1.2 mg/dL - - 0.5  Alkaline Phos 38 - 126 U/L - - 68  AST 15 - 41 U/L - - 42(H)  ALT 0 - 44 U/L - - 26    Assessment/Plan:   Active Problems:   Lower extremity weakness   Adjustment disorder with mixed disturbance of emotions and conduct   Subdural hematoma [name]   Patient Summary:  Jerry Flores is an 84 year old man with reported history of bipolar disorder who presents with chief complaint of bilateral lower extremity weakness and found to have large bilateral hemispheric subdural hematomas. Admitted for neurosurgical consult and further work-up.  This is hospital day 4. Patient is pending SNF placement.  Bilateral lower extremity weakness Bilateral hemispheric subdural hematomas Adjustment disorder with mixed disturbance of emotion and conduct vs bipolar disorder Patient evaluated by neurosurgery who note that bilateral SDH are chronic in nature and do not correlate to the patient's complaints. Patient is pleasant however continues to be difficult to redirect. On exam demonstrates 5/5 strength throughout if distracted with talking.  Patient evaluated by psychiatry, appreciate recs below.  Patient does not meet criteria for inpatient psychiatric care. PT/OT recommend SNF. Discharge  pending SNF placement. - 06/16/20 COVID swab negative - Psychiatry consulted, appreciate recs  - Depakote 500 mg twice daily  - Lorazepam 1 mg at bedtime PRN  Hypertension Patient's BP improved slightly after initiation of amlodipine yesterday, 130s-140s/60s-80s.   - Continue amlodipine 5 mg daily    Diet: Heart Healthy VTE: SCDs Code: Full PT/OT recs: SNF   Alphonzo Severance, MD Internal Medicine Resident PGY-1 Pager [phone] Please contact the on call pager after 5 pm and on weekends at [phone].

## 2020-06-17 NOTE — Progress Notes (Signed)
Attempted to call report to [facility]. I was on hold for greater than 25 minutes Patient was picked up as I was on hold without giving report.

## 2020-06-20 ENCOUNTER — Other Ambulatory Visit: Payer: Self-pay

## 2020-06-20 ENCOUNTER — Emergency Department (HOSPITAL_COMMUNITY): Payer: Medicare Other

## 2020-06-20 ENCOUNTER — Emergency Department (HOSPITAL_COMMUNITY)
Admission: EM | Admit: 2020-06-20 | Discharge: 2020-06-21 | Disposition: A | Discharge status: Home / Self Care | Payer: Medicare Other | Attending: Emergency Medicine | Admitting: Emergency Medicine

## 2020-06-20 ENCOUNTER — Encounter (HOSPITAL_COMMUNITY): Payer: Self-pay

## 2020-06-20 DIAGNOSIS — Z20822 Contact with and (suspected) exposure to covid-19: Secondary | ICD-10-CM | POA: Diagnosis not present

## 2020-06-20 DIAGNOSIS — W06XXXA Fall from bed, initial encounter: Secondary | ICD-10-CM | POA: Diagnosis not present

## 2020-06-20 DIAGNOSIS — S065X0A Traumatic subdural hemorrhage without loss of consciousness, initial encounter: Secondary | ICD-10-CM | POA: Insufficient documentation

## 2020-06-20 DIAGNOSIS — Z79899 Other long term (current) drug therapy: Secondary | ICD-10-CM | POA: Insufficient documentation

## 2020-06-20 DIAGNOSIS — I10 Essential (primary) hypertension: Secondary | ICD-10-CM | POA: Diagnosis not present

## 2020-06-20 DIAGNOSIS — S0990XA Unspecified injury of head, initial encounter: Secondary | ICD-10-CM | POA: Diagnosis present

## 2020-06-20 DIAGNOSIS — W19XXXA Unspecified fall, initial encounter: Secondary | ICD-10-CM

## 2020-06-20 LAB — CBC WITH DIFFERENTIAL/PLATELET
Abs Immature Granulocytes: 0.01 10*3/uL (ref 0.00–0.07)
Basophils Absolute: 0 10*3/uL (ref 0.0–0.1)
Basophils Relative: 1 %
Eosinophils Absolute: 0.1 10*3/uL (ref 0.0–0.5)
Eosinophils Relative: 3 %
HCT: 40.6 % (ref 39.0–52.0)
Hemoglobin: 12.9 g/dL — ABNORMAL LOW (ref 13.0–17.0)
Immature Granulocytes: 0 %
Lymphocytes Relative: 40 %
Lymphs Abs: 2.2 10*3/uL (ref 0.7–4.0)
MCH: 29.8 pg (ref 26.0–34.0)
MCHC: 31.8 g/dL (ref 30.0–36.0)
MCV: 93.8 fL (ref 80.0–100.0)
Monocytes Absolute: 0.6 10*3/uL (ref 0.1–1.0)
Monocytes Relative: 11 %
Neutro Abs: 2.6 10*3/uL (ref 1.7–7.7)
Neutrophils Relative %: 45 %
Platelets: 281 10*3/uL (ref 150–400)
RBC: 4.33 MIL/uL (ref 4.22–5.81)
RDW: 13 % (ref 11.5–15.5)
WBC: 5.6 10*3/uL (ref 4.0–10.5)
nRBC: 0 % (ref 0.0–0.2)

## 2020-06-20 LAB — ETHANOL: Alcohol, Ethyl (B): <10 mg/dL (ref <10)

## 2020-06-20 LAB — COMPREHENSIVE METABOLIC PANEL
ALT: 21 U/L (ref 0–44)
AST: 35 U/L (ref 15–41)
Albumin: 3.3 g/dL — ABNORMAL LOW (ref 3.5–5.0)
Alkaline Phosphatase: 80 U/L (ref 38–126)
Anion gap: 9 (ref 5–15)
BUN: 14 mg/dL (ref 8–23)
CO2: 24 mmol/L (ref 22–32)
Calcium: 9.3 mg/dL (ref 8.9–10.3)
Chloride: 101 mmol/L (ref 98–111)
Creatinine, Ser: 0.96 mg/dL (ref 0.61–1.24)
GFR, Estimated: >60 mL/min (ref >60)
Glucose, Bld: 99 mg/dL (ref 70–99)
Potassium: 5.1 mmol/L (ref 3.5–5.1)
Sodium: 134 mmol/L — ABNORMAL LOW (ref 135–145)
Total Bilirubin: 1 mg/dL (ref 0.3–1.2)
Total Protein: 7.6 g/dL (ref 6.5–8.1)

## 2020-06-20 LAB — RESP PANEL BY RT PCR (RSV, FLU A&B, COVID)
Influenza A by PCR: NEGATIVE
Influenza B by PCR: NEGATIVE
Respiratory Syncytial Virus by PCR: NEGATIVE
SARS Coronavirus 2 by RT PCR: NEGATIVE
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
## 2020-06-20 NOTE — ED Triage Notes (Signed)
Pt coming with EMS from [facility] after unwitnessed fall. Pt had no LOC; mechanical fall getting out of bed. Pt has no obvious injury; pain to forehead.

## 2020-06-20 NOTE — ED Provider Notes (Signed)
[facility] EMERGENCY DEPARTMENT Provider Note   CSN: [id number] Arrival date & time: 06/20/20  2110     History Chief Complaint  Patient presents with  . Fall    Jerry Flores is a 84 y.o. male.  HPI   Patient with significant medical history of bipolar disorder, spinal and foraminal stenosis, hypertension, subdermal hematoma hospitalized on 10/10 and discharged on the 14th.  Presents emergency department with chief complaint of mechanism fall.  Patient states while he was in his bed reaching for his cell phone he missed his phone causing him to fall out of bed landing directly on the left side of his face.  Patient states he did not lose conscious, is not on anticoagulant, denies headaches, change in vision, paresthesias or weakness in the upper or lower extremities.  Patient explains that he was on the floor for maybe 20 to 30 minutes as he was yelling for help. staff came and was able to get him up off the floor.  He denies any other pain at this time.  Patient has not taking any medications for his pain.  Patient denies headache, fever, chills, shortness of breath, chest pain, dumping, nausea, vomiting, diarrhea, pedal edema.  History reviewed. No pertinent past medical history.  Patient Active Problem List   Diagnosis Date Noted  . Subdural hematoma (HCC) 06/16/2020  . Adjustment disorder with mixed disturbance of emotions and conduct 06/15/2020  . Lower extremity weakness 06/13/2020    Past Surgical History:  Procedure Laterality Date  . HERNIA REPAIR         Family History  Family history unknown: Yes    Social History   Tobacco Use  . Smoking status: Never Smoker  . Smokeless tobacco: Never Used  Vaping Use  . Vaping Use: Never used  Substance Use Topics  . Alcohol use: Not Currently  . Drug use: Not Currently    Home Medications Prior to Admission medications   Medication Sig Start Date End Date Taking? Authorizing Provider    amLODipine (NORVASC) 5 MG tablet Take 1 tablet (5 mg total) by mouth daily. 06/18/20   Alphonzo Severance, MD  divalproex (DEPAKOTE) 500 MG DR tablet Take 1 tablet (500 mg total) by mouth every 12 (twelve) hours. 06/17/20 07/17/20  Alphonzo Severance, MD  LORazepam (ATIVAN) 1 MG tablet Take 1 tablet (1 mg total) by mouth at bedtime as needed (agitation). 06/17/20   Alphonzo Severance, MD  methocarbamol (ROBAXIN) 500 MG tablet Take 1 tablet (500 mg total) by mouth every 8 (eight) hours as needed for muscle spasms. Patient not taking: Reported on 06/13/2020 04/08/20   Wallis Bamberg, PA-C  traMADol (ULTRAM) 50 MG tablet Take 1 tablet (50 mg total) by mouth every 6 (six) hours as needed. Patient not taking: Reported on 06/13/2020 04/08/20   Wallis Bamberg, PA-C  triamcinolone cream (KENALOG) 0.1 % Apply 1 application topically 2 (two) times daily. Patient not taking: Reported on 06/13/2020 01/26/20   Myra Rude, MD    Allergies    Patient has no known allergies.  Review of Systems   Review of Systems  Constitutional: Negative for chills and fever.  HENT: Negative for congestion, tinnitus, trouble swallowing and voice change.   Eyes: Negative for visual disturbance.  Respiratory: Negative for shortness of breath.   Cardiovascular: Negative for chest pain.  Gastrointestinal: Negative for abdominal pain, diarrhea, nausea and vomiting.  Genitourinary: Negative for enuresis.  Musculoskeletal: Negative for back pain.  Skin: Negative for rash.  Neurological: Positive for headaches. Negative for dizziness and speech difficulty.  Hematological: Does not bruise/bleed easily.    Physical Exam Updated Vital Signs BP 130/62 (BP Location: Right Arm)   Pulse (!) 102   Temp 99.2 F (37.3 C) (Oral)   Resp 14   Ht 5\' 7"  (1.702 m)   Wt 95.3 kg   SpO2 97%   BMI 32.89 kg/m   Physical Exam Vitals and nursing note reviewed.  Constitutional:      General: He is not in acute distress.    Appearance: Normal  appearance. He is not ill-appearing or diaphoretic.  HENT:     Head: Normocephalic and atraumatic.     Nose: No congestion or rhinorrhea.     Mouth/Throat:     Mouth: Mucous membranes are moist.     Pharynx: Oropharynx is clear.  Eyes:     General: No visual field deficit or scleral icterus.    Conjunctiva/sclera: Conjunctivae normal.     Pupils: Pupils are equal, round, and reactive to light.  Cardiovascular:     Rate and Rhythm: Normal rate and regular rhythm.     Pulses: Normal pulses.     Heart sounds: No murmur heard.  No friction rub. No gallop.   Pulmonary:     Effort: Pulmonary effort is normal. No respiratory distress.     Breath sounds: No wheezing, rhonchi or rales.  Abdominal:     General: There is no distension.     Palpations: Abdomen is soft.     Tenderness: There is no abdominal tenderness. There is no right CVA tenderness, left CVA tenderness or guarding.  Musculoskeletal:        General: No swelling or tenderness.     Right lower leg: No edema.     Left lower leg: No edema.     Comments: Patient spine was visualized, with no gross abnormalities noted.  Nontender to palpation, no step-off, crepitus, or gross deformities noted.    Skin:    General: Skin is warm and dry.     Findings: No rash.  Neurological:     Mental Status: He is alert.     GCS: GCS eye subscore is 4. GCS verbal subscore is 5. GCS motor subscore is 6.     Cranial Nerves: Cranial nerves are intact. No cranial nerve deficit or facial asymmetry.     Sensory: Sensation is intact. No sensory deficit.     Motor: No weakness.     Coordination: Finger-Nose-Finger Test abnormal. Heel to Shin Test normal.     Comments: During finger-to-nose patient was able to complete exercise on his left upper extremity but when asked to perform same activity on right side patient was uncooperative.  He states " my brain will not let me move my right arm."  When he was not asked to perform movements he was moving  right extremity without difficulty.  Patient noted to have equal grip strength bilateraly, and full range of motion at the fingers, wrist, elbow, shoulder.    Psychiatric:        Mood and Affect: Mood normal.     ED Results / Procedures / Treatments   Labs (all labs ordered are listed, but only abnormal results are displayed) Labs Reviewed  COMPREHENSIVE METABOLIC PANEL - Abnormal; Notable for the following components:      Result Value   Sodium 134 (*)    Albumin 3.3 (*)    All other components within normal limits  CBC  WITH DIFFERENTIAL/PLATELET - Abnormal; Notable for the following components:   Hemoglobin 12.9 (*)    All other components within normal limits  RESP PANEL BY RT PCR (RSV, FLU A&B, COVID)  ETHANOL  URINALYSIS, ROUTINE W REFLEX MICROSCOPIC    EKG None  Radiology CT Head Wo Contrast  Result Date: 06/20/2020 CLINICAL DATA:  84 year old male with neck trauma. EXAM: CT HEAD WITHOUT CONTRAST CT CERVICAL SPINE WITHOUT CONTRAST TECHNIQUE: Multidetector CT imaging of the head and cervical spine was performed following the standard protocol without intravenous contrast. Multiplanar CT image reconstructions of the cervical spine were also generated. COMPARISON:  Head CT dated 06/13/2020. FINDINGS: CT HEAD FINDINGS Brain: There is acute small parafalcine subdural hemorrhage measuring up to 3-4 mm in thickness. No other acute intracranial hemorrhage. Large bilateral hemispheric low attenuating subdural collections as seen previously. There is associated mass effect on the bilateral convexities. No midline shift. Vascular: No hyperdense vessel or unexpected calcification. Skull: Normal. Negative for fracture or focal lesion. Sinuses/Orbits: No acute finding. Other: None CT CERVICAL SPINE FINDINGS Alignment: No acute subluxation.  Grade 1 C7-T1 anterolisthesis. Skull base and vertebrae: No acute cervical spine fracture. Age indeterminate fracture of the right first rib. Soft tissues  and spinal canal: No prevertebral fluid or swelling. No visible canal hematoma. Disc levels: Multilevel degenerative changes with endplate irregularity and disc space narrowing. Upper chest: Negative. Other: None IMPRESSION: 1. Small acute parafalcine subdural hemorrhage. 2. Large bilateral hemispheric mixed but predominantly low attenuating subdural collections as seen previously. No midline shift. 3. No acute/traumatic cervical spine pathology. 4. Age indeterminate fracture of the right first rib. These results were called by telephone at the time of interpretation on 06/20/2020 at 10:19 pm to provider Berle Mull , who verbally acknowledged these results. Electronically Signed   By: Elgie Collard M.D.   On: 06/20/2020 22:23   CT Cervical Spine Wo Contrast  Result Date: 06/20/2020 CLINICAL DATA:  84 year old male with neck trauma. EXAM: CT HEAD WITHOUT CONTRAST CT CERVICAL SPINE WITHOUT CONTRAST TECHNIQUE: Multidetector CT imaging of the head and cervical spine was performed following the standard protocol without intravenous contrast. Multiplanar CT image reconstructions of the cervical spine were also generated. COMPARISON:  Head CT dated 06/13/2020. FINDINGS: CT HEAD FINDINGS Brain: There is acute small parafalcine subdural hemorrhage measuring up to 3-4 mm in thickness. No other acute intracranial hemorrhage. Large bilateral hemispheric low attenuating subdural collections as seen previously. There is associated mass effect on the bilateral convexities. No midline shift. Vascular: No hyperdense vessel or unexpected calcification. Skull: Normal. Negative for fracture or focal lesion. Sinuses/Orbits: No acute finding. Other: None CT CERVICAL SPINE FINDINGS Alignment: No acute subluxation.  Grade 1 C7-T1 anterolisthesis. Skull base and vertebrae: No acute cervical spine fracture. Age indeterminate fracture of the right first rib. Soft tissues and spinal canal: No prevertebral fluid or swelling. No  visible canal hematoma. Disc levels: Multilevel degenerative changes with endplate irregularity and disc space narrowing. Upper chest: Negative. Other: None IMPRESSION: 1. Small acute parafalcine subdural hemorrhage. 2. Large bilateral hemispheric mixed but predominantly low attenuating subdural collections as seen previously. No midline shift. 3. No acute/traumatic cervical spine pathology. 4. Age indeterminate fracture of the right first rib. These results were called by telephone at the time of interpretation on 06/20/2020 at 10:19 pm to provider Berle Mull , who verbally acknowledged these results. Electronically Signed   By: Elgie Collard M.D.   On: 06/20/2020 22:23    Procedures Procedures (including critical care time)  Medications Ordered in ED Medications - No data to display  ED Course  I have reviewed the triage vital signs and the nursing notes.  Pertinent labs & imaging results that were available during my care of the patient were reviewed by me and considered in my medical decision making (see chart for details).    MDM Rules/Calculators/A&P                          I have personally reviewed all imaging, labs and have interpreted them.  Patient presents after having a mechanical fall. He was alert, did not appear in acute distress, vital signs reassuring. Will order imaging of his head and neck and obtain screening labs.  CBC negative for leukocytosis, does show normocytic anemia at 12.9.  CMP showing hyponatremia of 134, no metabolic acidosis, no signs of AKI, no anion gap present.  Ethanol was less than 10.  Respiratory panel negative for Covid, influenza a/B, RSV.  Head CT shows small acute parafalcine subdural hematoma. There is large bilateral hemiphasic mixed but predominantly low attenuating subdural collection. There is no acute/traumatic cervical spine pathology.  Due to new acute subdural hematoma will consult with neurosurgery for further recommendation  evaluation. Spoke with Dr. Franky Machoabbell of neurosurgery who states he will come down and evaluate the patient and provide further recommendations.  Dr. Franky Machoabbell came and assessed the patient and reviewed imaging.  He disagrees with radiology's interpretation and does not feel the subdermal hematoma is not contributing to the weakness in the right upper extremity.   I have low suspicion for systemic infection as patient is nontoxic-appearing, vital signs reassuring, no obvious source infection noted on exam, no leukocytosis noted on CBC.  Low suspicion for cardiac abnormality as patient denies chest pain, shortness of breath, no signs of hypoperfusion or fluid overloaded noted on exam.  Low suspicion for intra-abdominal abnormality as patient denies abdominal pain, tolerating p.o. without any difficulty, no acute abdomen noted on exam.  I suspect inability to complete finger-nose is more psychogenic in nature as he was able to move all 4 extremities without difficulty when distracted.  This is also noted in his chart from previous admission.  Will have patient follow-up with his PCP for further evaluation management.  Vital signs have remained stable, no indication for hospital admission.  Patient discussed with attending and they agreed with assessment and plan.  Patient given at home care as well strict return precautions.  Patient verbalized that they understood agreed to said plan.    Final Clinical Impression(s) / ED Diagnoses Final diagnoses:  Fall, initial encounter    Rx / DC Orders ED Discharge Orders    None       Carroll SageFaulkner, Chrishana Spargur J, PA-C 06/21/20 0024    Melene PlanFloyd, Dan, DO 06/23/20 1438

## 2020-06-20 NOTE — ED Notes (Signed)
Pt refused all blood work at this time. Will alert provider.

## 2020-06-21 NOTE — Discharge Instructions (Addendum)
Lab work and imaging all looks reassuring.  I recommend continue with your at home medications.  I would like you to follow-up with your PCP for further evaluation management.  I have given you the contact information for community health and wellness please contact them at your earliest convenience.  Come back to emergency department if you develop chest pain, shortness of breath, severe vomiting, uncontrolled nausea, vomiting, diarrhea.

## 2020-06-21 NOTE — ED Notes (Signed)
Called [facility] for transport to [facility]

## 2020-06-26 ENCOUNTER — Encounter (HOSPITAL_COMMUNITY): Payer: Self-pay

## 2020-06-26 ENCOUNTER — Emergency Department (HOSPITAL_COMMUNITY): Payer: Medicare Other

## 2020-06-26 ENCOUNTER — Emergency Department (HOSPITAL_COMMUNITY)
Admission: EM | Admit: 2020-06-26 | Discharge: 2020-06-27 | Disposition: A | Discharge status: Home / Self Care | Payer: Medicare Other | Attending: Emergency Medicine | Admitting: Emergency Medicine

## 2020-06-26 DIAGNOSIS — W19XXXA Unspecified fall, initial encounter: Secondary | ICD-10-CM

## 2020-06-26 DIAGNOSIS — I10 Essential (primary) hypertension: Secondary | ICD-10-CM | POA: Diagnosis not present

## 2020-06-26 DIAGNOSIS — Z79899 Other long term (current) drug therapy: Secondary | ICD-10-CM | POA: Insufficient documentation

## 2020-06-26 DIAGNOSIS — W06XXXA Fall from bed, initial encounter: Secondary | ICD-10-CM | POA: Diagnosis not present

## 2020-06-26 DIAGNOSIS — R519 Headache, unspecified: Secondary | ICD-10-CM | POA: Insufficient documentation

## 2020-06-26 DIAGNOSIS — M6281 Muscle weakness (generalized): Secondary | ICD-10-CM | POA: Diagnosis not present

## 2020-06-26 HISTORY — DX: Essential (primary) hypertension: I10

## 2020-06-26 HISTORY — DX: Traumatic subdural hemorrhage with loss of consciousness of unspecified duration, initial encounter: S06.5X9A

## 2020-06-26 HISTORY — DX: Other symptoms and signs involving the musculoskeletal system: R29.898

## 2020-06-26 HISTORY — DX: Adjustment disorder with mixed disturbance of emotions and conduct: F43.25

## 2020-06-26 LAB — CBC WITH DIFFERENTIAL/PLATELET
Abs Immature Granulocytes: 0.01 10*3/uL (ref 0.00–0.07)
Basophils Absolute: 0 10*3/uL (ref 0.0–0.1)
Basophils Relative: 1 %
Eosinophils Absolute: 0.1 10*3/uL (ref 0.0–0.5)
Eosinophils Relative: 2 %
HCT: 40.5 % (ref 39.0–52.0)
Hemoglobin: 13.1 g/dL (ref 13.0–17.0)
Immature Granulocytes: 0 %
Lymphocytes Relative: 40 %
Lymphs Abs: 2.4 10*3/uL (ref 0.7–4.0)
MCH: 29.6 pg (ref 26.0–34.0)
MCHC: 32.3 g/dL (ref 30.0–36.0)
MCV: 91.4 fL (ref 80.0–100.0)
Monocytes Absolute: 0.6 10*3/uL (ref 0.1–1.0)
Monocytes Relative: 10 %
Neutro Abs: 2.9 10*3/uL (ref 1.7–7.7)
Neutrophils Relative %: 47 %
Platelets: 376 10*3/uL (ref 150–400)
RBC: 4.43 MIL/uL (ref 4.22–5.81)
RDW: 12.9 % (ref 11.5–15.5)
WBC: 6 10*3/uL (ref 4.0–10.5)
nRBC: 0 % (ref 0.0–0.2)

## 2020-06-26 LAB — URINALYSIS, ROUTINE W REFLEX MICROSCOPIC
Bacteria, UA: NONE SEEN
Bilirubin Urine: NEGATIVE
Glucose, UA: NEGATIVE mg/dL
Hgb urine dipstick: NEGATIVE
Ketones, ur: 5 mg/dL — AB
Leukocytes,Ua: NEGATIVE
Nitrite: NEGATIVE
Protein, ur: NEGATIVE mg/dL
Specific Gravity, Urine: 1.023 (ref 1.005–1.030)
pH: 5 (ref 5.0–8.0)

## 2020-06-26 LAB — BASIC METABOLIC PANEL
Anion gap: 12 (ref 5–15)
BUN: 14 mg/dL (ref 8–23)
CO2: 24 mmol/L (ref 22–32)
Calcium: 9.6 mg/dL (ref 8.9–10.3)
Chloride: 100 mmol/L (ref 98–111)
Creatinine, Ser: 0.95 mg/dL (ref 0.61–1.24)
GFR, Estimated: >60 mL/min (ref >60)
Glucose, Bld: 102 mg/dL — ABNORMAL HIGH (ref 70–99)
Potassium: 4.2 mmol/L (ref 3.5–5.1)
Sodium: 136 mmol/L (ref 135–145)

## 2020-06-26 LAB — CBG MONITORING, ED: Glucose-Capillary: 98 mg/dL (ref 70–99)

## 2020-06-26 MED ORDER — MORPHINE SULFATE (PF) 2 MG/ML IV SOLN
1.0000 mg | Freq: Once | INTRAVENOUS | Status: AC
  Administered 2020-06-26: 1 mg via INTRAVENOUS
  Filled 2020-06-26: qty 1
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
## 2020-06-26 NOTE — ED Notes (Signed)
Pt place don bedpan at this time. Son at bedside to assist.
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
## 2020-06-26 NOTE — ED Notes (Signed)
Ptar called 

## 2020-06-26 NOTE — ED Triage Notes (Signed)
Pt arrived via [facility] from [facility] for an unwitnessed fall out of bed this morning. Pt states when he fell he fell on his right side of face. Unknown LOC. Pt not on blood thinners. Pt is A&Ox2 to self and place. Pt able to move all extremities. Pt denies numbness and tingling.

## 2020-06-26 NOTE — ED Notes (Signed)
Patient transported to CT 

## 2020-06-26 NOTE — ED Notes (Signed)
Pt able to move all extremities and denies numbness and tingling. Pt has 2+ pedal pulses and radial pulses bilat, cap refill less than 3 sec, sensation intact. Pt denies nausea or vision changes. Pt states he has a slight headache.

## 2020-06-26 NOTE — Discharge Instructions (Signed)
Your CT head and CT C-spine looked reassuring.  Particularly, your CT head look unchanged from previous.  Your blood work was reassuring.  Please follow-up with your primary care doctor.  Return emergency department for any vomiting, difficulty moving your arms or legs, difficulty breathing or any other worsening concerning symptoms.

## 2020-06-26 NOTE — ED Notes (Signed)
Pt removed his own c-collar. I informed prior to that that pt needed to keep it on until he got the ct scans done to make sure he doesn't have a broken neck and when I went to check on pt, he had the c-collar removed. I notified [place], [place].
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
## 2022-12-19 ENCOUNTER — Ambulatory Visit: Admission: EM | Admit: 2022-12-19 | Discharge: 2022-12-19 | Discharge status: Against Medical Advice | Payer: 59

## 2022-12-26 ENCOUNTER — Ambulatory Visit (HOSPITAL_COMMUNITY)
Admission: EM | Admit: 2022-12-26 | Discharge: 2022-12-26 | Disposition: A | Discharge status: Home / Self Care | Payer: 59 | Attending: Internal Medicine | Admitting: Internal Medicine

## 2022-12-26 ENCOUNTER — Encounter (HOSPITAL_COMMUNITY): Payer: Self-pay

## 2022-12-26 DIAGNOSIS — J209 Acute bronchitis, unspecified: Secondary | ICD-10-CM

## 2022-12-26 DIAGNOSIS — H109 Unspecified conjunctivitis: Secondary | ICD-10-CM | POA: Diagnosis not present

## 2022-12-26 MED ORDER — DOXYCYCLINE HYCLATE 100 MG PO CAPS: 100.0000 mg | ORAL_CAPSULE | Freq: Two times a day (BID) | ORAL | 0 refills | Status: DC

## 2022-12-26 MED ORDER — POLYMYXIN B-TRIMETHOPRIM 10000-0.1 UNIT/ML-% OP SOLN: 1.0000 [drp] | OPHTHALMIC | 0 refills | Status: AC

## 2022-12-26 NOTE — ED Provider Notes (Signed)
[facility]    CSN: [id number] Arrival date & time: 12/26/22  1353      History   Chief Complaint Chief Complaint  Patient presents with   Conjunctivitis    HPI Jerry Flores is a 87 y.o. male who presents with hx of bilateral itchy yes, nose congestion and rhinitis x 2 weeks. Has been using OTC red eye/ allergy eye drops which helped with itching, but is having cloudy matter from the R eye, and his lashes are stuck in the am's when he gets up.  Developed URI symptoms 1.5 weeks ago where for 2 nights he had sweating, so thinks he may have had a fever then. He did a covid test which was negative. The ST and R eye redness is better, but the cough is productive with yellow mucous. He has been fatigued. His appetite is normal.    Past Medical History:  Diagnosis Date   Adjustment disorder with mixed disturbance of emotions and conduct    Hypertension    Other symptoms and signs involving the musculoskeletal system    Traumatic subdural hemorrhage with loss of consciousness     Patient Active Problem List   Diagnosis Date Noted   Subdural hematoma 06/16/2020   Adjustment disorder with mixed disturbance of emotions and conduct 06/15/2020   Lower extremity weakness 06/13/2020    Past Surgical History:  Procedure Laterality Date   HERNIA REPAIR         Home Medications    Prior to Admission medications   Medication Sig Start Date End Date Taking? Authorizing Provider  doxycycline (VIBRAMYCIN) 100 MG capsule Take 1 capsule (100 mg total) by mouth 2 (two) times daily. 12/26/22  Yes Rodriguez-Southworth, Nettie Elm, PA-C  trimethoprim-polymyxin b (POLYTRIM) ophthalmic solution Place 1 drop into the right eye every 4 (four) hours. 12/26/22  Yes Rodriguez-Southworth, Nettie Elm, PA-C  amLODipine (NORVASC) 5 MG tablet Take 1 tablet (5 mg total) by mouth daily. 06/18/20   Alphonzo Severance, MD  divalproex (DEPAKOTE) 500 MG DR tablet Take 1 tablet (500 mg total) by mouth every 12  (twelve) hours. 06/17/20 07/17/20  Alphonzo Severance, MD  LORazepam (ATIVAN) 1 MG tablet Take 1 tablet (1 mg total) by mouth at bedtime as needed (agitation). 06/17/20   Alphonzo Severance, MD    Family History Family History  Family history unknown: Yes    Social History Social History   Tobacco Use   Smoking status: Never   Smokeless tobacco: Never  Vaping Use   Vaping Use: Never used  Substance Use Topics   Alcohol use: Not Currently   Drug use: Not Currently     Allergies   Patient has no known allergies.   Review of Systems Review of Systems As noted in HPI  Physical Exam Triage Vital Signs ED Triage Vitals  Enc Vitals Group     BP 12/26/22 1546 (!) 156/89     Pulse Rate 12/26/22 1546 77     Resp 12/26/22 1546 16     Temp 12/26/22 1546 98 F (36.7 C)     Temp Source 12/26/22 1546 Oral     SpO2 12/26/22 1546 92 %     Weight --      Height --      Head Circumference --      Peak Flow --      Pain Score 12/26/22 1547 0     Pain Loc --      Pain Edu? --  Excl. in GC? --    No data found.  Updated Vital Signs BP (!) 156/89 (BP Location: Left Arm)   Pulse 77   Temp 98 F (36.7 C) (Oral)   Resp 16   SpO2 92%  Repeated pulse ox after he coughed 98% Visual Acuity Right Eye Distance:   Left Eye Distance:   Bilateral Distance:    Right Eye Near:   Left Eye Near:    Bilateral Near:     Physical Exam Physical Exam Constitutional:      General: He is not in acute distress.    Appearance: He is not toxic-appearing.  HENT: R eye- with cloudy matter in his tear corner area with mild R lateral sclera injection and mid medially. No perilimbal erythema noted.     Head: Normocephalic.     Right Ear: Tympanic membrane, ear canal and external ear normal.     Left Ear: Ear canal and external ear normal.     Nose: Nose normal.     Mouth/Throat: clear    Mouth: Mucous membranes are moist.     Pharynx: Oropharynx is clear.  Eyes:     General: No scleral  icterus.    Conjunctiva/sclera: Conjunctivae normal.  Cardiovascular:     Rate and Rhythm: Normal rate and regular rhythm.     Heart sounds: No murmur heard.   Pulmonary:     Effort: Pulmonary effort is normal. No respiratory distress.     Breath sounds: mild Wheezing present on RLL, improved after coughing.      Musculoskeletal:        General: Normal range of motion.     Cervical back: Neck supple.  Lymphadenopathy:     Cervical: No cervical adenopathy.  Skin:    General: Skin is warm and dry.     Findings: No rash.  Neurological:     Mental Status: He is alert and oriented to person, place, and time.     Gait: Gait normal.  Psychiatric:        Mood and Affect: Mood normal.        Behavior: Behavior normal.        Thought Content: Thought content normal.        Judgment: Judgment normal.    UC Treatments / Results  Labs (all labs ordered are listed, but only abnormal results are displayed) Labs Reviewed - No data to display  EKG   Radiology No results found.  Procedures Procedures (including critical care time)  Medications Ordered in UC Medications - No data to display  Initial Impression / Assessment and Plan / UC Course  I have reviewed the triage vital signs and the nursing notes.  R bacterial conjunctivitis Acute bronchitis  I placed him on Polytrim eye drops and Doxy as noted.  He has a physical scheduled with his PCP next month and was encouraged to keep it and have labs specially if the fatigue persists.     Final Clinical Impressions(s) / UC Diagnoses   Final diagnoses:  Acute bronchitis, unspecified organism  Bacterial conjunctivitis of right eye   Discharge Instructions   None    ED Prescriptions     Medication Sig Dispense Auth. Provider   trimethoprim-polymyxin b (POLYTRIM) ophthalmic solution Place 1 drop into the right eye every 4 (four) hours. 10 mL Rodriguez-Southworth, Nettie Elm, PA-C   doxycycline (VIBRAMYCIN) 100 MG capsule Take  1 capsule (100 mg total) by mouth 2 (two) times daily. 20 capsule Rodriguez-Southworth, Nettie Elm, [place]  PDMP not reviewed this encounter.   Garey Ham, [place] 12/26/22 1642

## 2022-12-26 NOTE — ED Triage Notes (Signed)
Pt states both of his eyes are red and itchy for the past 2 weeks.  Also states he has a sore throat that is getting better and a cough.  Has been taking OTC cold medicine and eye drops at home.

## 2023-02-06 ENCOUNTER — Other Ambulatory Visit: Payer: Self-pay | Admitting: Internal Medicine

## 2023-02-07 LAB — COMPLETE METABOLIC PANEL WITH GFR
AG Ratio: 1.4 (calc) (ref 1.0–2.5)
ALT: 22 U/L (ref 9–46)
AST: 22 U/L (ref 10–35)
Albumin: 4.4 g/dL (ref 3.6–5.1)
Alkaline phosphatase (APISO): 60 U/L (ref 35–144)
BUN: 9 mg/dL (ref 7–25)
CO2: 24 mmol/L (ref 20–32)
Calcium: 9.8 mg/dL (ref 8.6–10.3)
Chloride: 105 mmol/L (ref 98–110)
Creat: 1.02 mg/dL (ref 0.70–1.22)
Globulin: 3.2 g/dL (calc) (ref 1.9–3.7)
Glucose, Bld: 97 mg/dL (ref 65–99)
Potassium: 4 mmol/L (ref 3.5–5.3)
Sodium: 139 mmol/L (ref 135–146)
Total Bilirubin: 0.8 mg/dL (ref 0.2–1.2)
Total Protein: 7.6 g/dL (ref 6.1–8.1)
eGFR: 72 mL/min/{1.73_m2} (ref >=60)

## 2023-02-07 LAB — LIPID PANEL
Cholesterol: 205 mg/dL — ABNORMAL HIGH (ref <200)
HDL: 38 mg/dL — ABNORMAL LOW (ref >=40)
LDL Cholesterol (Calc): 135 mg/dL (calc) — ABNORMAL HIGH
Non-HDL Cholesterol (Calc): 167 mg/dL (calc) — ABNORMAL HIGH (ref <130)
Total CHOL/HDL Ratio: 5.4 (calc) — ABNORMAL HIGH (ref <5.0)
Triglycerides: 181 mg/dL — ABNORMAL HIGH (ref <150)

## 2023-02-07 LAB — CBC
HCT: 44.7 % (ref 38.5–50.0)
Hemoglobin: 14.7 g/dL (ref 13.2–17.1)
MCH: 31 pg (ref 27.0–33.0)
MCHC: 32.9 g/dL (ref 32.0–36.0)
MCV: 94.3 fL (ref 80.0–100.0)
MPV: 9.6 fL (ref 7.5–12.5)
Platelets: 253 10*3/uL (ref 140–400)
RBC: 4.74 10*6/uL (ref 4.20–5.80)
RDW: 13.1 % (ref 11.0–15.0)
WBC: 4 10*3/uL (ref 3.8–10.8)

## 2023-11-08 ENCOUNTER — Encounter (HOSPITAL_COMMUNITY): Payer: Self-pay

## 2023-11-08 ENCOUNTER — Ambulatory Visit (HOSPITAL_COMMUNITY): Admission: EM | Admit: 2023-11-08 | Discharge: 2023-11-08 | Disposition: A | Discharge status: Home / Self Care

## 2023-11-08 DIAGNOSIS — J069 Acute upper respiratory infection, unspecified: Secondary | ICD-10-CM

## 2023-11-08 LAB — POC COVID19/FLU A&B COMBO
Covid Antigen, POC: NEGATIVE
Influenza A Antigen, POC: NEGATIVE
Influenza B Antigen, POC: NEGATIVE

## 2023-11-08 MED ORDER — AZELASTINE HCL 0.1 % NA SOLN: 1.0000 | Freq: Two times a day (BID) | NASAL | 1 refills | Status: AC

## 2023-11-08 MED ORDER — BENZONATATE 200 MG PO CAPS: 200.0000 mg | ORAL_CAPSULE | Freq: Three times a day (TID) | ORAL | 0 refills | Status: DC | PRN

## 2023-11-08 NOTE — ED Provider Notes (Signed)
 [facility] [place]  Note:  This document was prepared using Dragon voice recognition software and may include unintentional dictation errors.  MRN: [id number] DOB: 12/31/1935  Subjective:   Jerry Flores is a 88 y.o. male presenting for nasal congestion, cough, fatigue x 1 week.  Patient denies much production cough.  Patient is been taking Zyrtec 10 mg and Robitussin close.  Patient states that he begins to feel like he is getting better and then symptoms get worse.  Patient also his wife has been sick since Sunday.  Patient denies shortness of breath, chest pain, weakness, dizziness.  No current facility-administered medications for this encounter.  Current Outpatient Medications:    azelastine (ASTELIN) 0.1 % nasal spray, Place 1 spray into both nostrils 2 (two) times daily. Use in each nostril as directed, Disp: 30 mL, Rfl: 1   benzonatate (TESSALON) 200 MG capsule, Take 1 capsule (200 mg total) by mouth 3 (three) times daily as needed for cough., Disp: 20 capsule, Rfl: 0   amLODipine (NORVASC) 5 MG tablet, Take 1 tablet (5 mg total) by mouth daily., Disp: 30 tablet, Rfl: 0   divalproex (DEPAKOTE) 500 MG DR tablet, Take 1 tablet (500 mg total) by mouth every 12 (twelve) hours., Disp: 60 tablet, Rfl: 0   doxycycline (VIBRAMYCIN) 100 MG capsule, Take 1 capsule (100 mg total) by mouth 2 (two) times daily., Disp: 20 capsule, Rfl: 0   LORazepam (ATIVAN) 1 MG tablet, Take 1 tablet (1 mg total) by mouth at bedtime as needed (agitation)., Disp: 10 tablet, Rfl: 0   trimethoprim-polymyxin b (POLYTRIM) ophthalmic solution, Place 1 drop into the right eye every 4 (four) hours., Disp: 10 mL, Rfl: 0   No Known Allergies  Past Medical History:  Diagnosis Date   Adjustment disorder with mixed disturbance of emotions and conduct    Hypertension    Other symptoms and signs involving the musculoskeletal system    Traumatic subdural hemorrhage with loss of consciousness (HCC)      Past  Surgical History:  Procedure Laterality Date   HERNIA REPAIR      Family History  Family history unknown: Yes    Social History   Tobacco Use   Smoking status: Never   Smokeless tobacco: Never  Vaping Use   Vaping status: Never Used  Substance Use Topics   Alcohol use: Not Currently   Drug use: Not Currently    ROS Refer to HPI for ROS details.  Objective:   Vitals: BP 97/60 (BP Location: Right Arm)   Pulse 77   Temp 98.2 F (36.8 C) (Oral)   Resp 18   Ht 5\' 7"  (1.702 m)   Wt 199 lb 15.3 oz (90.7 kg)   SpO2 97%   BMI 31.32 kg/m   Physical Exam Vitals and nursing note reviewed.  Constitutional:      General: He is not in acute distress.    Appearance: Normal appearance. He is well-developed. He is not ill-appearing or toxic-appearing.  HENT:     Head: Normocephalic and atraumatic.     Nose: Congestion and rhinorrhea present.  Cardiovascular:     Rate and Rhythm: Normal rate and regular rhythm.     Heart sounds: No murmur heard. Pulmonary:     Effort: Pulmonary effort is normal. No respiratory distress.     Breath sounds: Normal breath sounds. No stridor. No wheezing, rhonchi or rales.  Musculoskeletal:     Cervical back: Neck supple.  Skin:    General: Skin  is warm and dry.     Capillary Refill: Capillary refill takes less than 2 seconds.  Neurological:     General: No focal deficit present.     Mental Status: He is alert and oriented to person, place, and time.  Psychiatric:        Mood and Affect: Mood normal.     Procedures  Results for orders placed or performed during the hospital encounter of 11/08/23 (from the past 24 hours)  POC Covid19/Flu A&B Antigen     Status: Normal   Collection Time: 11/08/23  5:29 PM  Result Value Ref Range   Influenza A Antigen, POC Negative    Influenza B Antigen, POC Negative    Covid Antigen, POC Negative     Assessment and Plan :   PDMP not reviewed this encounter.  1. Viral URI with cough     Viral  URI with cough -Rapid testing for COVID and influenza are both negative. -Cause of symptoms is most likely viral related to other viral illnesses in the community. -Prescribed azelastine nasal spray 0.1% twice daily as needed for cough and postnasal drip -Benzonatate 200 mg capsule 3 times daily as needed for cough suppression -Continue using Delsym cough syrup as directed for cough suppression with benzonatate. -Continue taking Zyrtec 10 mg as needed daily for postnasal drip and nasal congestion. -Take ibuprofen or Tylenol as needed for inflammation and pain secondary to viral illness.  Lucky Cowboy   [place], [place], [place] 11/08/23 1742

## 2023-11-08 NOTE — Discharge Instructions (Addendum)
 Viral URI with cough -Rapid testing for COVID and influenza are both negative. -Cause of symptoms is most likely viral related to other viral illnesses in the community. -Prescribed azelastine nasal spray 0.1% twice daily as needed for cough and postnasal drip -Benzonatate 200 mg capsule 3 times daily as needed for cough suppression -Continue using Delsym cough syrup as directed for cough suppression with benzonatate. -Continue taking Zyrtec 10 mg as needed daily for postnasal drip and nasal congestion. -Take ibuprofen or Tylenol as needed for inflammation and pain secondary to viral illness. -If symptoms worsen or new symptoms arise follow-up for further evaluation and management

## 2023-11-08 NOTE — ED Triage Notes (Signed)
 Chief Complaint: cough, fatigue, nasal congestion  Sick exposure: Yes- Wife is sick and also being seen   Onset: 1 week ago   Prescriptions or OTC medications tried: Yes- Robitussin, Zyrtec 10 mg    with mild relief

## 2024-05-23 ENCOUNTER — Ambulatory Visit (INDEPENDENT_AMBULATORY_CARE_PROVIDER_SITE_OTHER)

## 2024-05-23 ENCOUNTER — Ambulatory Visit
Admission: RE | Admit: 2024-05-23 | Discharge: 2024-05-23 | Disposition: A | Discharge status: Home / Self Care | Source: Ambulatory Visit | Attending: Physician Assistant | Admitting: Physician Assistant

## 2024-05-23 VITALS — BP 103/72 | HR 85 | Temp 98.7°F | Resp 16

## 2024-05-23 DIAGNOSIS — J329 Chronic sinusitis, unspecified: Secondary | ICD-10-CM | POA: Diagnosis not present

## 2024-05-23 DIAGNOSIS — J40 Bronchitis, not specified as acute or chronic: Secondary | ICD-10-CM | POA: Diagnosis not present

## 2024-05-23 DIAGNOSIS — R051 Acute cough: Secondary | ICD-10-CM

## 2024-05-23 LAB — POC SOFIA SARS ANTIGEN FIA: SARS Coronavirus 2 Ag: NEGATIVE

## 2024-05-23 MED ORDER — BENZONATATE 100 MG PO CAPS: 100.0000 mg | ORAL_CAPSULE | Freq: Three times a day (TID) | ORAL | 0 refills | Status: AC

## 2024-05-23 MED ORDER — DOXYCYCLINE HYCLATE 100 MG PO CAPS: 100.0000 mg | ORAL_CAPSULE | Freq: Two times a day (BID) | ORAL | 0 refills | Status: AC

## 2024-05-23 NOTE — ED Triage Notes (Signed)
 Pt present with a cough x four days. Pt states he had a positive covid test at home. States he has taken Robitussin.

## 2024-05-23 NOTE — ED Provider Notes (Signed)
 [facility]    CSN: [id number] Arrival date & time: 05/23/24  1210      History   Chief Complaint Chief Complaint  Patient presents with   covid test    HPI Jerry Flores is a 88 y.o. male.   Patient presents today companied by his wife who help provide majority of history.  Reports that about 1 week ago he developed URI symptoms including congestion and cough.  He took an at-home COVID test on Tuesday (05/20/2024) that was positive.  He has been using over-the-counter medication including Robitussin with temporary improvement of symptoms.  He denies any history of allergies, asthma, COPD, smoking.  He has had COVID before with the last episode in 2023.  He has not had COVID vaccines or boosters.  He denies any recent antibiotics or steroids.  He denies any chest pain, shortness of breath, fever, nausea, vomiting, diarrhea.  He is concerned because he continues to have cough despite over-the-counter medication.    Past Medical History:  Diagnosis Date   Adjustment disorder with mixed disturbance of emotions and conduct    Hypertension    Other symptoms and signs involving the musculoskeletal system    Traumatic subdural hemorrhage with loss of consciousness [facility])     Patient Active Problem List   Diagnosis Date Noted   Subdural hematoma (HCC) 06/16/2020   Adjustment disorder with mixed disturbance of emotions and conduct 06/15/2020   Lower extremity weakness 06/13/2020    Past Surgical History:  Procedure Laterality Date   HERNIA REPAIR         Home Medications    Prior to Admission medications   Medication Sig Start Date End Date Taking? Authorizing Provider  benzonatate  (TESSALON ) 100 MG capsule Take 1 capsule (100 mg total) by mouth every 8 (eight) hours. 05/23/24  Yes Armanie Martine K, PA-C  amLODipine  (NORVASC ) 5 MG tablet Take 1 tablet (5 mg total) by mouth daily. 06/18/20   Mathew Bread, MD  azelastine  (ASTELIN ) 0.1 % nasal spray Place 1 spray  into both nostrils 2 (two) times daily. Use in each nostril as directed 11/08/23   Reddick, Johnathan B, NP  divalproex  (DEPAKOTE ) 500 MG DR tablet Take 1 tablet (500 mg total) by mouth every 12 (twelve) hours. 06/17/20 07/17/20  Mathew Bread, MD  doxycycline  (VIBRAMYCIN ) 100 MG capsule Take 1 capsule (100 mg total) by mouth 2 (two) times daily. 05/23/24   Timm Bonenberger K, PA-C  LORazepam  (ATIVAN ) 1 MG tablet Take 1 tablet (1 mg total) by mouth at bedtime as needed (agitation). 06/17/20   Mathew Bread, MD  trimethoprim -polymyxin b  (POLYTRIM ) ophthalmic solution Place 1 drop into the right eye every 4 (four) hours. 12/26/22   Rodriguez-Southworth, Kyra, PA-C    Family History Family History  Family history unknown: Yes    Social History Social History   Tobacco Use   Smoking status: Never   Smokeless tobacco: Never  Vaping Use   Vaping status: Never Used  Substance Use Topics   Alcohol use: Not Currently   Drug use: Not Currently     Allergies   Patient has no known allergies.   Review of Systems Review of Systems  Constitutional:  Positive for activity change. Negative for appetite change, fatigue and fever.  HENT:  Positive for congestion and sinus pressure. Negative for sneezing and sore throat.   Respiratory:  Positive for cough. Negative for shortness of breath.   Cardiovascular:  Negative for chest pain.  Gastrointestinal:  Negative  for abdominal pain, diarrhea, nausea and vomiting.  Neurological:  Negative for dizziness, light-headedness and headaches.     Physical Exam Triage Vital Signs ED Triage Vitals  Encounter Vitals Group     BP 05/23/24 1230 103/72     Girls Systolic BP Percentile --      Girls Diastolic BP Percentile --      Boys Systolic BP Percentile --      Boys Diastolic BP Percentile --      Pulse Rate 05/23/24 1230 85     Resp 05/23/24 1230 16     Temp 05/23/24 1230 98.7 F (37.1 C)     Temp Source 05/23/24 1230 Oral     SpO2 05/23/24 1230 96  %     Weight --      Height --      Head Circumference --      Peak Flow --      Pain Score 05/23/24 1229 0     Pain Loc --      Pain Education --      Exclude from Growth Chart --    No data found.  Updated Vital Signs BP 103/72 (BP Location: Left Arm)   Pulse 85   Temp 98.7 F (37.1 C) (Oral)   Resp 16   SpO2 96%   Visual Acuity Right Eye Distance:   Left Eye Distance:   Bilateral Distance:    Right Eye Near:   Left Eye Near:    Bilateral Near:     Physical Exam Vitals reviewed.  Constitutional:      General: He is awake.     Appearance: Normal appearance. He is well-developed. He is not ill-appearing.     Comments: Very pleasant male appears stated age in no acute distress sitting comfortably in exam room  HENT:     Head: Normocephalic and atraumatic.     Right Ear: Tympanic membrane, ear canal and external ear normal. Tympanic membrane is not erythematous or bulging.     Left Ear: Tympanic membrane, ear canal and external ear normal. Tympanic membrane is not erythematous or bulging.     Nose: Nose normal.     Mouth/Throat:     Pharynx: Uvula midline. No oropharyngeal exudate, posterior oropharyngeal erythema or uvula swelling.  Cardiovascular:     Rate and Rhythm: Normal rate and regular rhythm.     Heart sounds: Normal heart sounds, S1 normal and S2 normal. No murmur heard. Pulmonary:     Effort: Pulmonary effort is normal. No accessory muscle usage or respiratory distress.     Breath sounds: No stridor. Examination of the right-lower field reveals decreased breath sounds. Examination of the left-lower field reveals decreased breath sounds. Decreased breath sounds present. No wheezing, rhonchi or rales.  Neurological:     Mental Status: He is alert.  Psychiatric:        Behavior: Behavior is cooperative.      UC Treatments / Results  Labs (all labs ordered are listed, but only abnormal results are displayed) Labs Reviewed  POC SOFIA SARS ANTIGEN FIA     EKG   Radiology DG Chest 2 View Result Date: 05/23/2024 CLINICAL DATA:  Acute cough. EXAM: DG CHEST 2V COMPARISON:  06/10/2020. FINDINGS: Bilateral lung fields are clear. Bilateral costophrenic angles are clear. Normal cardio-mediastinal silhouette. No acute osseous abnormalities. The soft tissues are within normal limits. IMPRESSION: No active cardiopulmonary disease. Electronically Signed   By: Ree Molt M.D.   On: 05/23/2024 13:12  Procedures Procedures (including critical care time)  Medications Ordered in UC Medications - No data to display  Initial Impression / Assessment and Plan / UC Course  I have reviewed the triage vital signs and the nursing notes.  Pertinent labs & imaging results that were available during my care of the patient were reviewed by me and considered in my medical decision making (see chart for details).     Patient is well-appearing, afebrile, nontoxic, nontachycardic.  We discussed that he is already been symptomatic for more than 5 days so viral testing is not indicated but he was very interested in having this done since he had a positive test at home.  It was negative today and we discussed that his symptoms might have initially been COVID but I am not concerned about a secondary infection given his persistent and worsening symptoms.  Will treat for sinobronchitis with doxycycline  100 mg twice daily for 7 days.  Discussed that he is to avoid prolonged sun exposure while on this medication.  He was given Tessalon  for cough.  Recommended over-the-counter medication including Mucinex, Flonase, Tylenol , nasal saline/sinus rinses.  Chest x-ray was obtained that showed no acute cardiopulmonary disease.  We discussed that he should follow-up with his primary care if his symptoms are not resolved within a week.  If anything worsens he needs to be seen immediately.  Strict return precautions given.  All questions answered patient satisfaction.  Final  Clinical Impressions(s) / UC Diagnoses   Final diagnoses:  Acute cough  Sinobronchitis     Discharge Instructions      You were negative for COVID in clinic and your chest x-ray was normal.  We are treating you for a sinus/bronchitis infection.  Start doxycycline  100 mg twice daily for 7 days.  Stay out of the sun while on this medication.  Use Tessalon  up to 3 times a day for cough.  I also recommend over-the-counter medication including Mucinex, Flonase, Tylenol , nasal saline/sinus rinses.  If you are not feeling better in a week please return for reevaluation.  If anything worsens and you have high fever, worsening cough, shortness of breath, chest pain you need to be seen immediately.     ED Prescriptions     Medication Sig Dispense Auth. Provider   doxycycline  (VIBRAMYCIN ) 100 MG capsule Take 1 capsule (100 mg total) by mouth 2 (two) times daily. 14 capsule Samone Guhl K, PA-C   benzonatate  (TESSALON ) 100 MG capsule Take 1 capsule (100 mg total) by mouth every 8 (eight) hours. 21 capsule Treyton Slimp K, PA-C      PDMP not reviewed this encounter.   Sherrell Rocky POUR, PA-C 05/23/24 1317

## 2024-05-23 NOTE — Discharge Instructions (Signed)
 You were negative for COVID in clinic and your chest x-ray was normal.  We are treating you for a sinus/bronchitis infection.  Start doxycycline  100 mg twice daily for 7 days.  Stay out of the sun while on this medication.  Use Tessalon  up to 3 times a day for cough.  I also recommend over-the-counter medication including Mucinex, Flonase, Tylenol , nasal saline/sinus rinses.  If you are not feeling better in a week please return for reevaluation.  If anything worsens and you have high fever, worsening cough, shortness of breath, chest pain you need to be seen immediately.
